# Patient Record
Sex: Female | Born: 1990 | Race: White | Hispanic: No | Marital: Single | Smoking: Never smoker
Health system: Southern US, Community
[De-identification: ages and names within clinical notes are randomized; demographics above are authoritative.]

## PROBLEM LIST (undated history)

## (undated) HISTORY — PX: BACK SURGERY: SHX140

---

## 2019-11-03 ENCOUNTER — Other Ambulatory Visit: Payer: Self-pay | Admitting: Orthopedic Surgery

## 2019-11-03 DIAGNOSIS — M4726 Other spondylosis with radiculopathy, lumbar region: Secondary | ICD-10-CM

## 2019-11-07 ENCOUNTER — Other Ambulatory Visit: Payer: Self-pay | Admitting: Cardiology

## 2019-11-14 ENCOUNTER — Ambulatory Visit
Admission: RE | Admit: 2019-11-14 | Discharge: 2019-11-14 | Disposition: A | Discharge status: Home / Self Care | Payer: 59 | Source: Ambulatory Visit | Attending: Orthopedic Surgery | Admitting: Orthopedic Surgery

## 2019-11-14 ENCOUNTER — Other Ambulatory Visit: Payer: Self-pay

## 2019-11-14 DIAGNOSIS — M4726 Other spondylosis with radiculopathy, lumbar region: Secondary | ICD-10-CM

## 2021-07-28 ENCOUNTER — Encounter (HOSPITAL_BASED_OUTPATIENT_CLINIC_OR_DEPARTMENT_OTHER): Payer: Self-pay | Admitting: Emergency Medicine

## 2021-07-28 ENCOUNTER — Other Ambulatory Visit: Payer: Self-pay

## 2021-07-28 ENCOUNTER — Emergency Department (HOSPITAL_BASED_OUTPATIENT_CLINIC_OR_DEPARTMENT_OTHER): Payer: Managed Care, Other (non HMO)

## 2021-07-28 ENCOUNTER — Emergency Department (HOSPITAL_BASED_OUTPATIENT_CLINIC_OR_DEPARTMENT_OTHER)
Admission: EM | Admit: 2021-07-28 | Discharge: 2021-07-28 | Disposition: A | Discharge status: Home / Self Care | Payer: Managed Care, Other (non HMO) | Attending: Emergency Medicine | Admitting: Emergency Medicine

## 2021-07-28 DIAGNOSIS — R102 Pelvic and perineal pain: Secondary | ICD-10-CM

## 2021-07-28 DIAGNOSIS — R1032 Left lower quadrant pain: Secondary | ICD-10-CM | POA: Diagnosis not present

## 2021-07-28 LAB — URINALYSIS, ROUTINE W REFLEX MICROSCOPIC
Bilirubin Urine: NEGATIVE
Glucose, UA: NEGATIVE mg/dL
Hgb urine dipstick: NEGATIVE
Ketones, ur: NEGATIVE mg/dL
Leukocytes,Ua: NEGATIVE
Nitrite: NEGATIVE
Protein, ur: NEGATIVE mg/dL
Specific Gravity, Urine: 1.015 (ref 1.005–1.030)
pH: 5.5 (ref 5.0–8.0)

## 2021-07-28 LAB — COMPREHENSIVE METABOLIC PANEL
ALT: 7 U/L (ref 0–44)
AST: 10 U/L — ABNORMAL LOW (ref 15–41)
Albumin: 4.6 g/dL (ref 3.5–5.0)
Alkaline Phosphatase: 35 U/L — ABNORMAL LOW (ref 38–126)
Anion gap: 10 (ref 5–15)
BUN: 7 mg/dL (ref 6–20)
CO2: 22 mmol/L (ref 22–32)
Calcium: 9.3 mg/dL (ref 8.9–10.3)
Chloride: 106 mmol/L (ref 98–111)
Creatinine, Ser: 0.58 mg/dL (ref 0.44–1.00)
GFR, Estimated: >60 mL/min (ref >60)
Glucose, Bld: 125 mg/dL — ABNORMAL HIGH (ref 70–99)
Potassium: 3.4 mmol/L — ABNORMAL LOW (ref 3.5–5.1)
Sodium: 138 mmol/L (ref 135–145)
Total Bilirubin: 0.4 mg/dL (ref 0.3–1.2)
Total Protein: 7.3 g/dL (ref 6.5–8.1)

## 2021-07-28 LAB — CBC
HCT: 40 % (ref 36.0–46.0)
Hemoglobin: 13.6 g/dL (ref 12.0–15.0)
MCH: 32.5 pg (ref 26.0–34.0)
MCHC: 34 g/dL (ref 30.0–36.0)
MCV: 95.5 fL (ref 80.0–100.0)
Platelets: 276 10*3/uL (ref 150–400)
RBC: 4.19 MIL/uL (ref 3.87–5.11)
RDW: 12.2 % (ref 11.5–15.5)
WBC: 10.4 10*3/uL (ref 4.0–10.5)
nRBC: 0 % (ref 0.0–0.2)

## 2021-07-28 LAB — LIPASE, BLOOD: Lipase: 37 U/L (ref 11–51)

## 2021-07-28 LAB — PREGNANCY, URINE: Preg Test, Ur: NEGATIVE

## 2021-07-28 MED ORDER — IOHEXOL 300 MG/ML  SOLN
80.0000 mL | Freq: Once | INTRAMUSCULAR | Status: AC | PRN
  Administered 2021-07-28: 80 mL via INTRAVENOUS

## 2021-07-28 NOTE — ED Provider Notes (Signed)
[facility] EMERGENCY DEPT Provider Note   CSN: [id number] Arrival date & time: 07/28/21  1557     History Chief Complaint  Patient presents with   Abdominal Pain    Morgan Townsend is a 30 y.o. female.   Abdominal Pain Pain location:  Suprapubic Pain quality: cramping   Pain radiates to:  LLQ Pain severity:  Severe Onset quality:  Sudden Duration:  3 hours Timing:  Intermittent Progression:  Improving Chronicity:  New Relieved by:  Acetaminophen Worsened by:  Position changes Ineffective treatments:  Lying down Associated symptoms: dysuria and nausea   Associated symptoms: no chest pain, no chills, no constipation, no cough, no diarrhea, no fatigue, no fever, no hematuria, no shortness of breath, no sore throat, no vaginal bleeding, no vaginal discharge and no vomiting   Patient presents for left lower quadrant abdominal pain.  Onset was today, shortly prior to arrival.  Pain has been severe.  Last onset.  Was 4 weeks ago.  She has not had any recent vaginal bleeding, discharge, or hematuria.    History reviewed. No pertinent past medical history.  There are no problems to display for this patient.   History reviewed. No pertinent surgical history.   OB History   No obstetric history on file.     History reviewed. No pertinent family history.     Home Medications Prior to Admission medications   Not on File    Allergies    Patient has no known allergies.  Review of Systems   Review of Systems  Constitutional:  Negative for activity change, appetite change, chills, diaphoresis, fatigue and fever.  HENT:  Negative for congestion, ear pain and sore throat.   Eyes:  Negative for pain and visual disturbance.  Respiratory:  Negative for cough, chest tightness, shortness of breath and wheezing.   Cardiovascular:  Negative for chest pain and palpitations.  Gastrointestinal:  Positive for abdominal pain and nausea. Negative for blood in stool,  constipation, diarrhea and vomiting.  Genitourinary:  Positive for dysuria and pelvic pain. Negative for flank pain, frequency, hematuria, vaginal bleeding and vaginal discharge.  Musculoskeletal:  Negative for arthralgias, back pain, gait problem, joint swelling, myalgias and neck pain.  Skin:  Negative for color change and rash.  Neurological:  Positive for light-headedness. Negative for dizziness, tremors, seizures, syncope, weakness, numbness and headaches.  Hematological:  Does not bruise/bleed easily.  Psychiatric/Behavioral:  Negative for confusion and decreased concentration.   All other systems reviewed and are negative.  Physical Exam Updated Vital Signs BP 110/71   Pulse 75   Temp 98.5 F (36.9 C) (Oral)   Resp 18   Ht 5\' 6"  (1.676 m)   Wt 61.2 kg   SpO2 99%   BMI 21.79 kg/m   Physical Exam Vitals and nursing note reviewed.  Constitutional:      General: She is not in acute distress.    Appearance: She is well-developed and normal weight. She is not ill-appearing, toxic-appearing or diaphoretic.  HENT:     Head: Normocephalic and atraumatic.     Mouth/Throat:     Mouth: Mucous membranes are moist.     Pharynx: Oropharynx is clear.  Eyes:     General: No scleral icterus.    Conjunctiva/sclera: Conjunctivae normal.     Pupils: Pupils are equal, round, and reactive to light.  Cardiovascular:     Rate and Rhythm: Normal rate and regular rhythm.     Heart sounds: No murmur heard. Pulmonary:  Effort: Pulmonary effort is normal. No respiratory distress.     Breath sounds: Normal breath sounds. No wheezing or rales.  Abdominal:     Palpations: Abdomen is soft.     Tenderness: There is abdominal tenderness in the right lower quadrant, periumbilical area, suprapubic area and left lower quadrant. There is no right CVA tenderness, left CVA tenderness, guarding or rebound.  Musculoskeletal:        General: No swelling.     Cervical back: Neck supple.  Skin:     General: Skin is warm and dry.     Capillary Refill: Capillary refill takes less than 2 seconds.     Coloration: Skin is not jaundiced or pale.  Neurological:     General: No focal deficit present.     Mental Status: She is alert and oriented to person, place, and time.  Psychiatric:        Mood and Affect: Mood normal.        Behavior: Behavior normal.    ED Results / Procedures / Treatments   Labs (all labs ordered are listed, but only abnormal results are displayed) Labs Reviewed  COMPREHENSIVE METABOLIC PANEL - Abnormal; Notable for the following components:      Result Value   Potassium 3.4 (*)    Glucose, Bld 125 (*)    AST 10 (*)    Alkaline Phosphatase 35 (*)    All other components within normal limits  URINALYSIS, ROUTINE W REFLEX MICROSCOPIC - Abnormal; Notable for the following components:   APPearance HAZY (*)    All other components within normal limits  LIPASE, BLOOD  CBC  PREGNANCY, URINE    EKG None  Radiology CT ABDOMEN PELVIS W CONTRAST  Result Date: 07/28/2021 CLINICAL DATA:  Left lower quadrant abdominal pain, nausea, vomiting EXAM: CT ABDOMEN AND PELVIS WITH CONTRAST TECHNIQUE: Multidetector CT imaging of the abdomen and pelvis was performed using the standard protocol following bolus administration of intravenous contrast. CONTRAST:  70mL OMNIPAQUE IOHEXOL 300 MG/ML  SOLN COMPARISON:  None. FINDINGS: Lower chest: 3 mm nodule in the left lower lobe peripherally. No effusions. Hepatobiliary: No focal hepatic abnormality. Gallbladder unremarkable. Pancreas: No focal abnormality or ductal dilatation. Spleen: No focal abnormality.  Normal size. Adrenals/Urinary Tract: No adrenal abnormality. No focal renal abnormality. No stones or hydronephrosis. Urinary bladder is unremarkable. Stomach/Bowel: Appendix not visualized. Bowel is decompressed. No bowel obstruction. Grossly unremarkable. Vascular/Lymphatic: No evidence of aneurysm or adenopathy. Reproductive:  Uterus and left ovary unremarkable. Cystic areas within the right ovary measuring up to 2.3 cm, likely small functional cysts or follicles. Other: Moderate free fluid in the pelvis and in the right paracolic gutter. Musculoskeletal: No acute bony abnormality. IMPRESSION: Moderate free fluid in the pelvis. Small cysts or follicles in the right ovary. Appendix not visualized. No inflammatory process in the right lower quadrant. Electronically Signed   By: Rolm Baptise M.D.   On: 07/28/2021 20:01   US PELVIC COMPLETE W TRANSVAGINAL AND TORSION R/O  Result Date: 07/28/2021 CLINICAL DATA:  Left lower quadrant pain EXAM: TRANSABDOMINAL AND TRANSVAGINAL ULTRASOUND OF PELVIS DOPPLER ULTRASOUND OF OVARIES TECHNIQUE: Both transabdominal and transvaginal ultrasound examinations of the pelvis were performed. Transabdominal technique was performed for global imaging of the pelvis including uterus, ovaries, adnexal regions, and pelvic cul-de-sac. It was necessary to proceed with endovaginal exam following the transabdominal exam to visualize the uterus, endometrium, ovaries and adnexa. Color and duplex Doppler ultrasound was utilized to evaluate blood flow to the ovaries. COMPARISON:  None. FINDINGS: Uterus Measurements: 8.6 x 4.3 x 5.7 cm = volume: 111 mL. 1.8 cm posterior subserosal fundal fibroid. Endometrium Thickness: 7 mm in thickness.  No focal abnormality visualized. Right ovary Measurements: 4.6 x 4.1 x 4.1 cm = volume: 40 mL. 2.2 cm hyperechoic lesion, possibly small dermoid. Left ovary Measurements: 2.3 x 2.4 x 2.5 cm = volume: 7.2 mL. Normal appearance/no adnexal mass. Pulsed Doppler evaluation of both ovaries demonstrates normal low-resistance arterial and venous waveforms. Other findings Large amount of free fluid in the pelvis. IMPRESSION: 2.2 cm hyperechoic area within the right ovary could reflect small dermoid cyst. No evidence of ovarian torsion. Large free fluid in the pelvis. Electronically Signed   By:  Charlett Nose M.D.   On: 07/28/2021 18:45    Procedures Procedures   Medications Ordered in ED Medications  iohexol (OMNIPAQUE) 300 MG/ML solution 80 mL (80 mLs Intravenous Contrast Given 07/28/21 1937)    ED Course  I have reviewed the triage vital signs and the nursing notes.  Pertinent labs & imaging results that were available during my care of the patient were reviewed by me and considered in my medical decision making (see chart for details).    MDM Rules/Calculators/A&P                          Patient presents for acute onset of lower abdominal/pelvic pain.  Pain is midline but does radiate more to the left side.  Vital signs upon arrival are normal.  Patient is well-appearing.  She does have some tenderness to the lower abdomen on exam.  Exam is otherwise unremarkable.  Patient declines any analgesia or nausea medicine at this time.  Laboratory work-up was initiated prior to being bedded in the ED.  Pregnancy test was negative.  Pelvic ultrasound was ordered to assess for adnexal etiology.  Ultrasound showed no evidence of torsion.  There was findings consistent with a possible dermoid cyst on the right ovary.  Left ovary was normal in appearance.  There was a large amount of free fluid present in pelvis.  This is likely from a ruptured cyst.  Free fluid does explain her symptoms.  Given the large amount of fluid, CT scan of abdomen and pelvis was ordered to rule out other sources of pelvic free fluid.  CT scan showed redemonstration of right ovarian cysts, and pelvic free fluid.  There were no other acute findings.  Patient had no worsening of her symptoms while in the ED.  She was advised to continue analgesia at home with ibuprofen and to return to the ED if symptoms worsen.  She was discharged in good condition.  Final Clinical Impression(s) / ED Diagnoses Final diagnoses:  Pelvic pain    Rx / DC Orders ED Discharge Orders     None        Gloris Manchester, MD 07/30/21  [phone]

## 2021-07-28 NOTE — ED Notes (Signed)
Patient transported to Ultrasound 

## 2021-07-28 NOTE — ED Triage Notes (Signed)
Pt arrives to ED with c/o LLQ abdominal pain. This started this afternoon. The pain is severe, intermittent, and radiates up her abdomen. Pt denies vaginal bleeding, dysuria, hematuria. Pt reports lower back surgery x3 weeks ago. LMP 11/17.

## 2021-12-29 ENCOUNTER — Other Ambulatory Visit: Payer: Self-pay | Admitting: Orthopaedic Surgery

## 2021-12-29 DIAGNOSIS — M5116 Intervertebral disc disorders with radiculopathy, lumbar region: Secondary | ICD-10-CM

## 2022-01-10 ENCOUNTER — Ambulatory Visit
Admission: RE | Admit: 2022-01-10 | Discharge: 2022-01-10 | Disposition: A | Discharge status: Home / Self Care | Payer: Managed Care, Other (non HMO) | Source: Ambulatory Visit | Attending: Orthopaedic Surgery | Admitting: Orthopaedic Surgery

## 2022-01-10 DIAGNOSIS — M5116 Intervertebral disc disorders with radiculopathy, lumbar region: Secondary | ICD-10-CM

## 2022-08-26 LAB — OB RESULTS CONSOLE GC/CHLAMYDIA
Chlamydia: NEGATIVE
Neisseria Gonorrhea: NEGATIVE

## 2022-09-18 LAB — OB RESULTS CONSOLE HIV ANTIBODY (ROUTINE TESTING): HIV: NONREACTIVE

## 2022-09-18 LAB — HEPATITIS C ANTIBODY: HCV Ab: NEGATIVE

## 2022-09-18 LAB — OB RESULTS CONSOLE HEPATITIS B SURFACE ANTIGEN: Hepatitis B Surface Ag: NEGATIVE

## 2022-09-18 LAB — OB RESULTS CONSOLE ABO/RH: RH Type: POSITIVE

## 2022-09-18 LAB — OB RESULTS CONSOLE RPR: RPR: NONREACTIVE

## 2022-09-18 LAB — OB RESULTS CONSOLE ANTIBODY SCREEN: Antibody Screen: NEGATIVE

## 2022-09-18 LAB — OB RESULTS CONSOLE RUBELLA ANTIBODY, IGM: Rubella: IMMUNE

## 2022-10-15 ENCOUNTER — Other Ambulatory Visit: Payer: Self-pay | Admitting: Obstetrics and Gynecology

## 2022-10-15 DIAGNOSIS — Z363 Encounter for antenatal screening for malformations: Secondary | ICD-10-CM

## 2022-11-11 ENCOUNTER — Encounter: Payer: Self-pay | Admitting: *Deleted

## 2022-11-19 ENCOUNTER — Ambulatory Visit: Payer: BC Managed Care – PPO | Attending: Obstetrics and Gynecology

## 2022-11-19 DIAGNOSIS — Z363 Encounter for antenatal screening for malformations: Secondary | ICD-10-CM

## 2022-11-20 ENCOUNTER — Other Ambulatory Visit: Payer: Self-pay | Admitting: *Deleted

## 2022-11-20 DIAGNOSIS — Z362 Encounter for other antenatal screening follow-up: Secondary | ICD-10-CM

## 2022-12-13 IMAGING — US US PELVIS COMPLETE TRANSABD/TRANSVAG W DUPLEX
1 series · 13 of 25 positions shown · non-contrast
Comparison: None.

CLINICAL DATA: Left lower quadrant pain

EXAM:
TRANSABDOMINAL AND TRANSVAGINAL ULTRASOUND OF PELVIS
DOPPLER ULTRASOUND OF OVARIES
TECHNIQUE: Both transabdominal and transvaginal ultrasound examinations of the
pelvis were performed. Transabdominal technique was performed for
global imaging of the pelvis including uterus, ovaries, adnexal
regions, and pelvic cul-de-sac.
It was necessary to proceed with endovaginal exam following the
transabdominal exam to visualize the uterus, endometrium, ovaries
and adnexa. Color and duplex Doppler ultrasound was utilized to
evaluate blood flow to the ovaries.

[Series 1: us pelvic complete w transvaginal and torsion righ · 72 acquisitions, 13 frames shown]
[im 1/72]
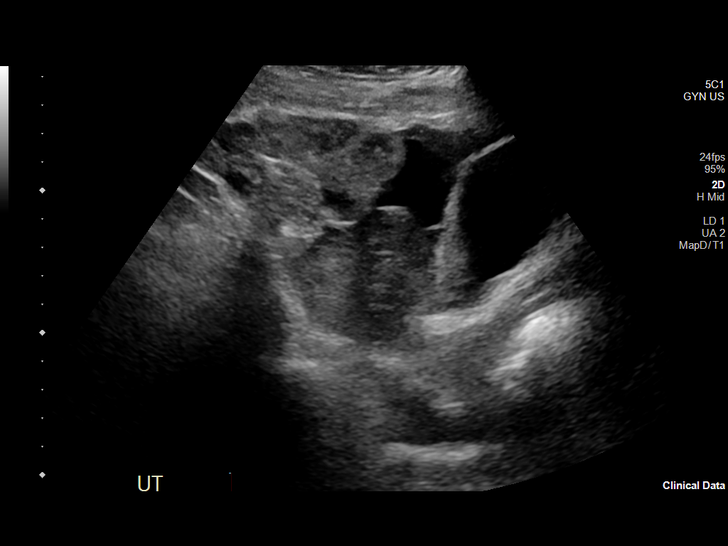
[im 6/72]
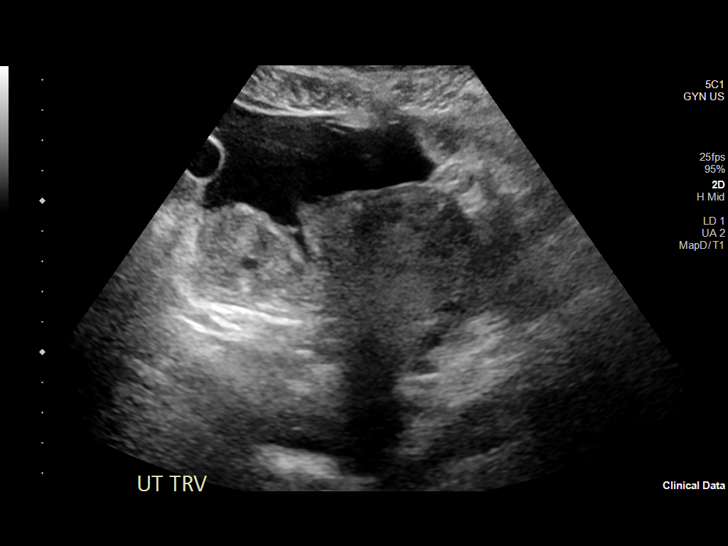
[im 12/72]
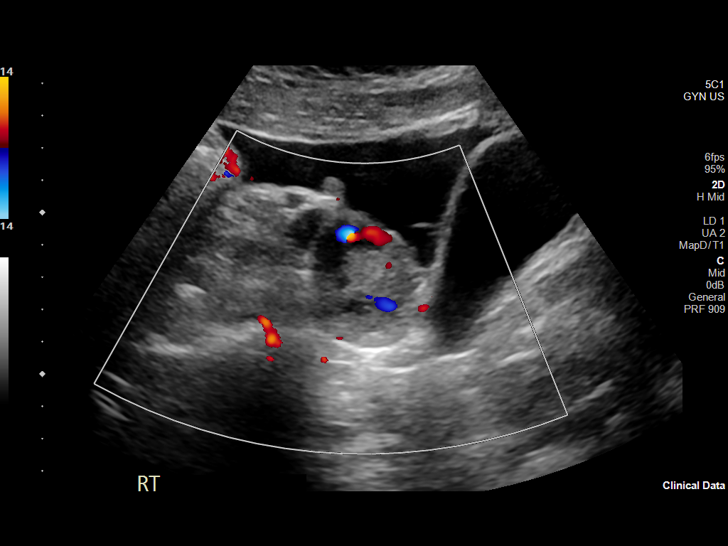
[im 18/72]
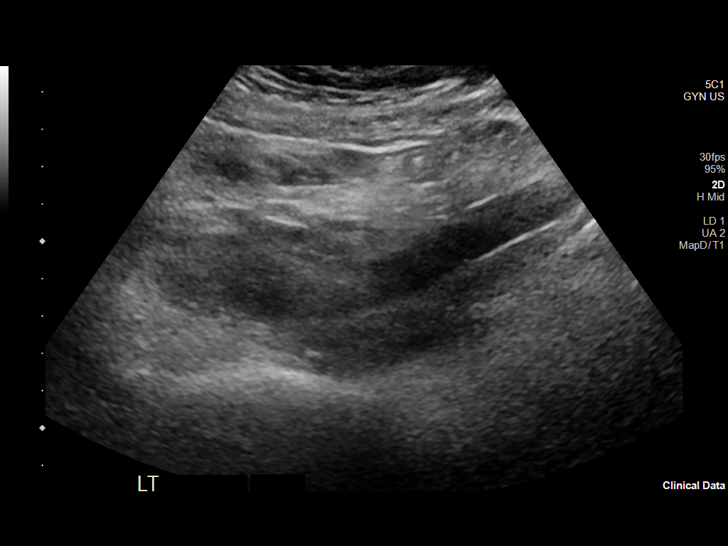
[im 24/72]
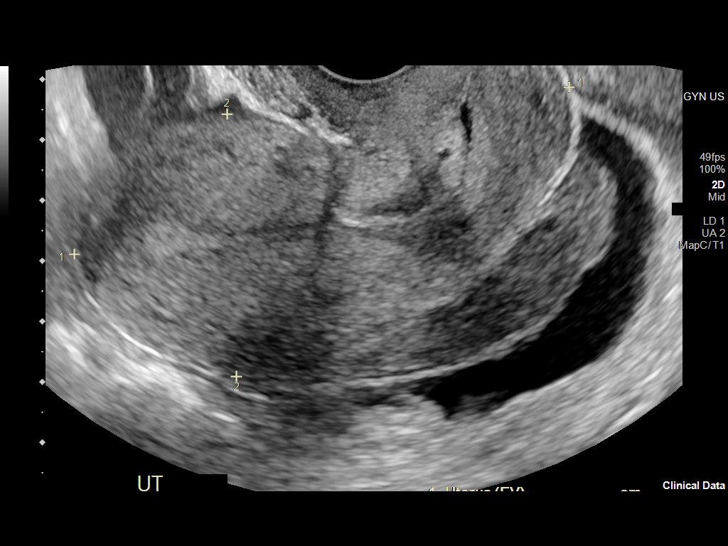
[im 30/72]
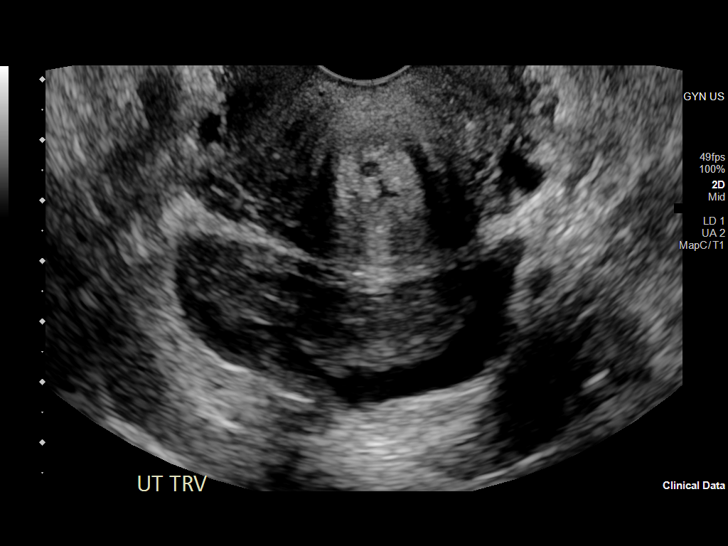
[im 36/72]
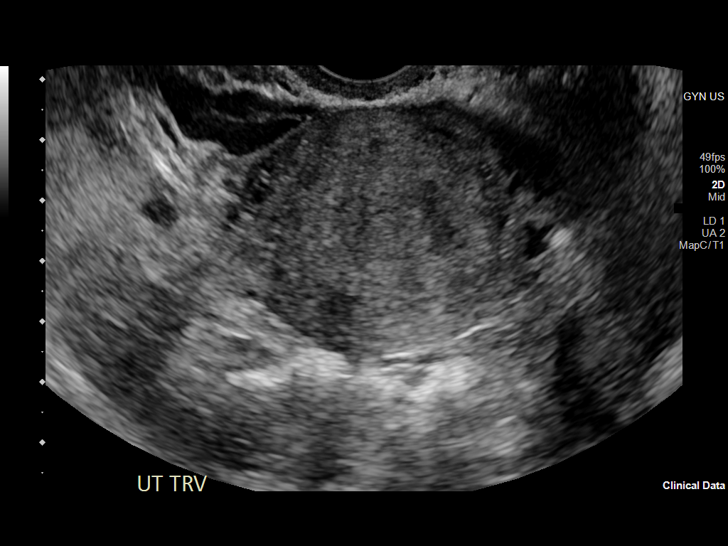
[im 42/72]
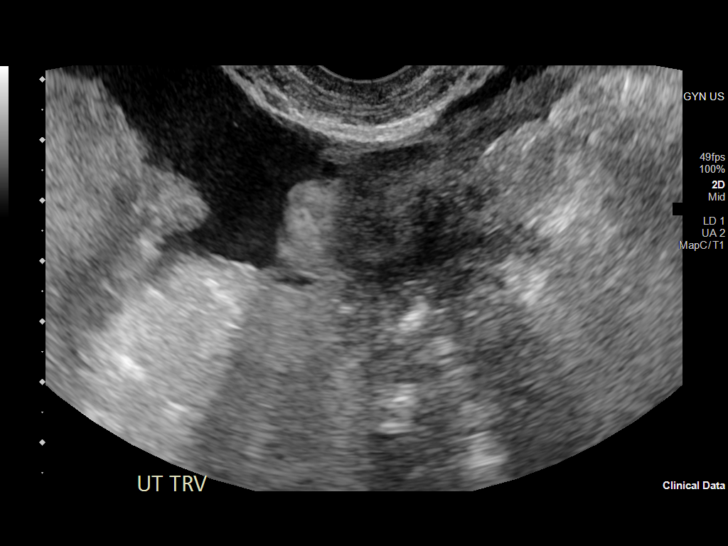
[im 48/72]
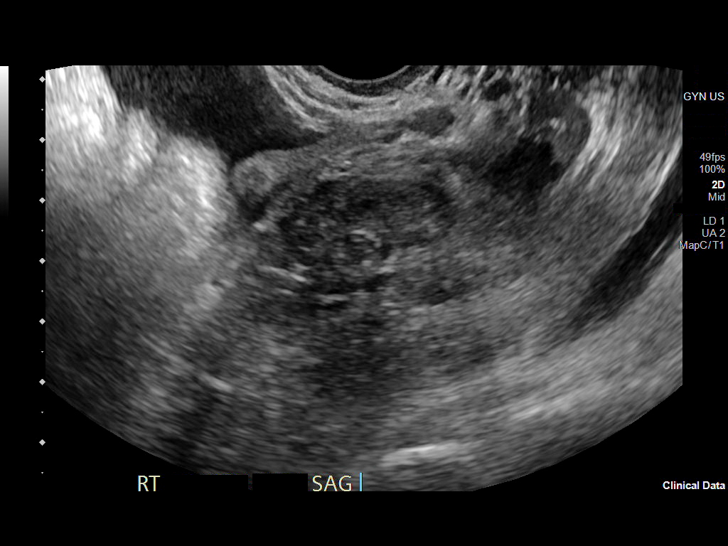
[im 54/72]
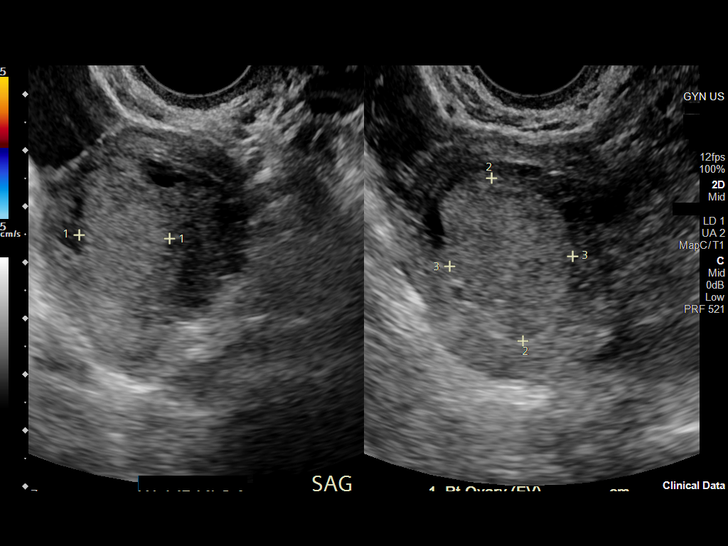
[im 60/72]
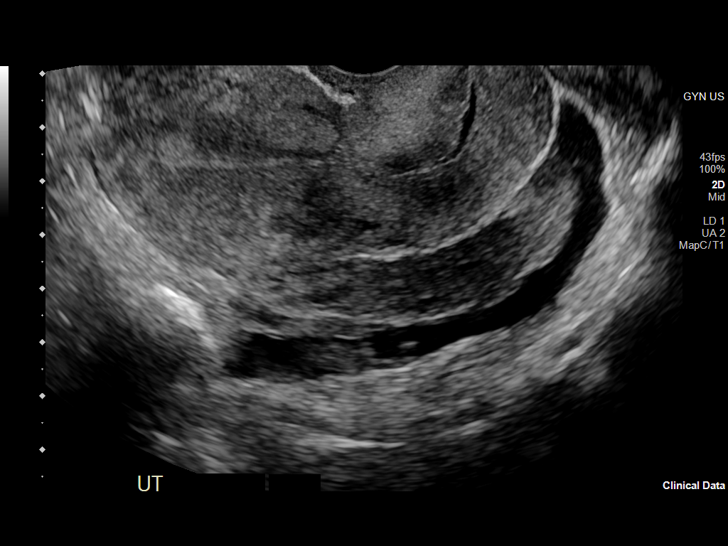
[im 66/72]
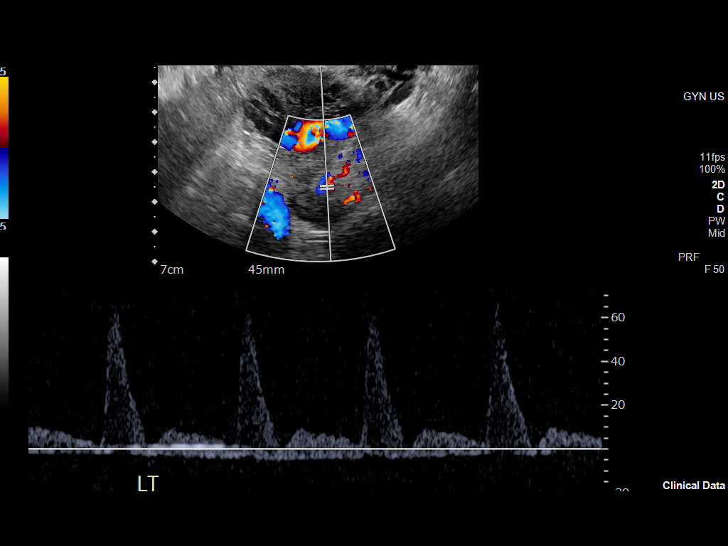
[im 72/72]
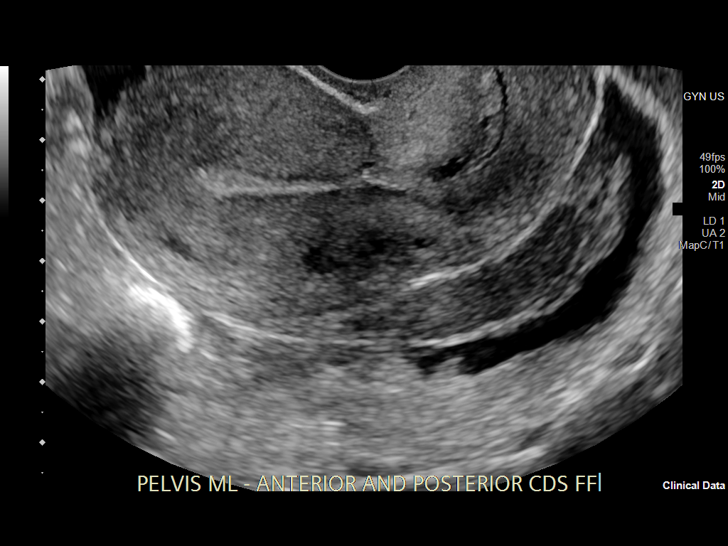

[13 of 25 positions shown; findings below may reference images not displayed]

FINDINGS: Uterus

Measurements: 8.6 x 4.3 x 5.7 cm = volume: 111 mL. 1.8 cm posterior
subserosal fundal fibroid.

Endometrium

Thickness: 7 mm in thickness.  No focal abnormality visualized.

Right ovary

Measurements: 4.6 x 4.1 x 4.1 cm = volume: 40 mL. 2.2 cm hyperechoic
lesion, possibly small dermoid.

Left ovary

Measurements: 2.3 x 2.4 x 2.5 cm = volume: 7.2 mL. Normal
appearance/no adnexal mass.

Pulsed Doppler evaluation of both ovaries demonstrates normal
low-resistance arterial and venous waveforms.

Other findings

Large amount of free fluid in the pelvis.
IMPRESSION: 2.2 cm hyperechoic area within the right ovary could reflect small
dermoid cyst.

No evidence of ovarian torsion.

Large free fluid in the pelvis.

## 2022-12-13 IMAGING — CT CT ABD-PELV W/ CM
2 of 4 series · 16 of 46 positions shown, 18 images · IV contrast (APPLIED)
Comparison: None.

CLINICAL DATA: Left lower quadrant abdominal pain, nausea, vomiting

EXAM:
CT ABDOMEN AND PELVIS WITH CONTRAST
TECHNIQUE: Multidetector CT imaging of the abdomen and pelvis was performed
using the standard protocol following bolus administration of
intravenous contrast.
CONTRAST:  80mL OMNIPAQUE IOHEXOL 300 MG/ML  SOLN

[Series 2: abd pel w · axial · 0.66mm/px · z∈[+747,+1122]mm · 13 of 83 slices shown, 15 images]
[im 4/83  soft-tissue]
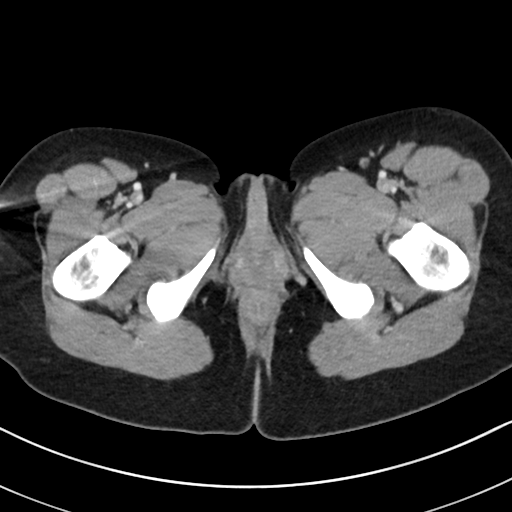
[im 4/83  bone]
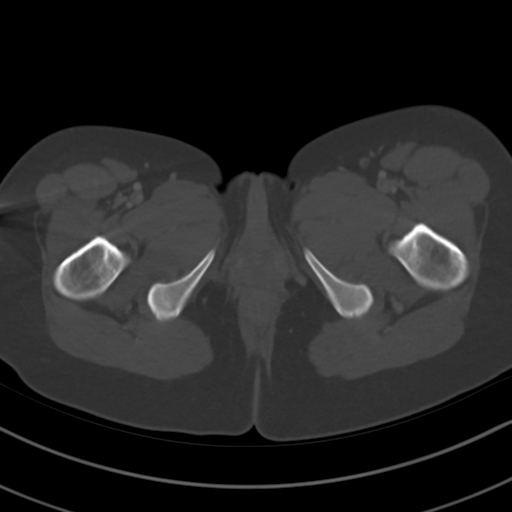
[im 11/83  soft-tissue]
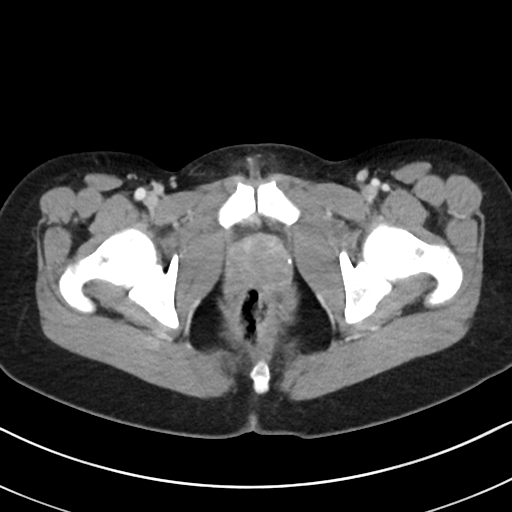
[im 18/83  soft-tissue]
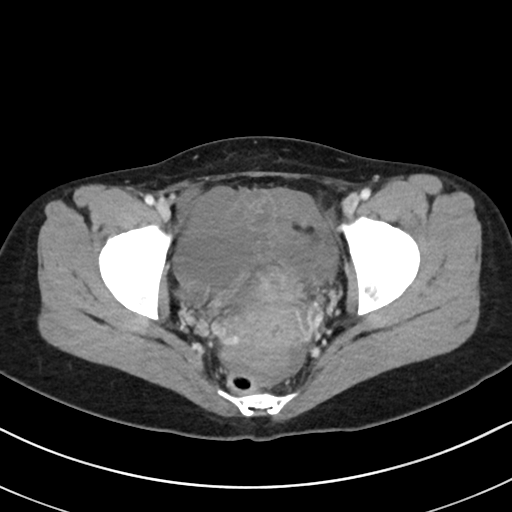
[im 24/83  soft-tissue]
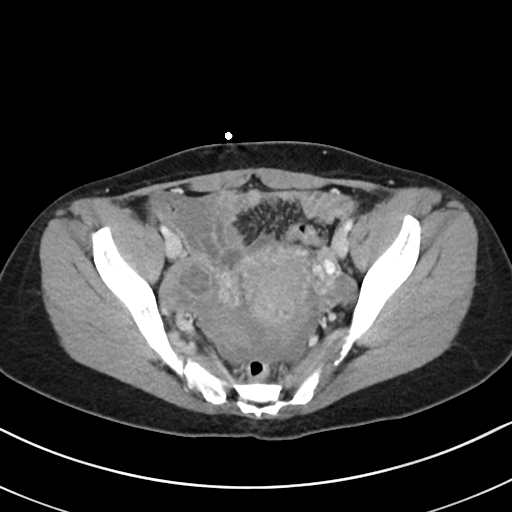
[im 28/83  soft-tissue]
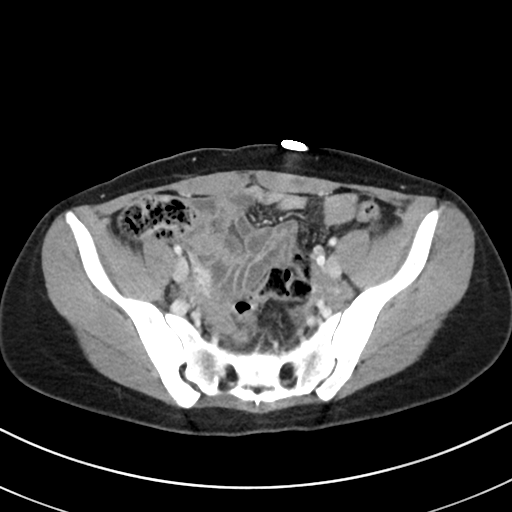
[im 35/83  soft-tissue]
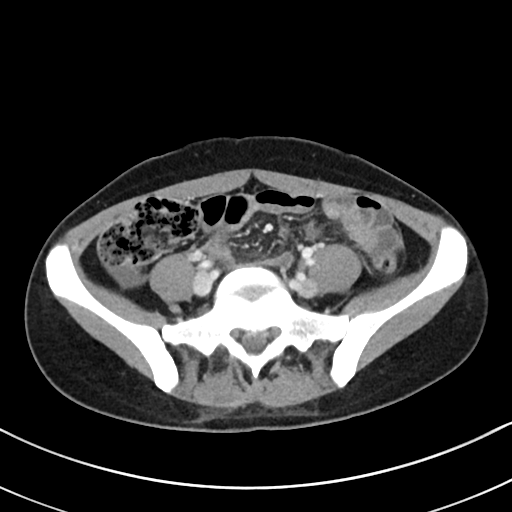
[im 42/83  soft-tissue]
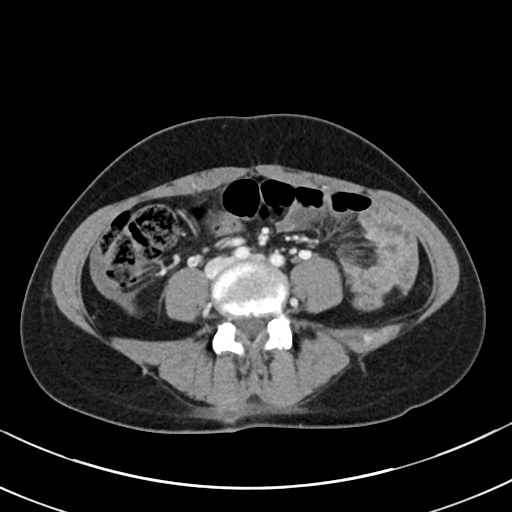
[im 48/83  soft-tissue]
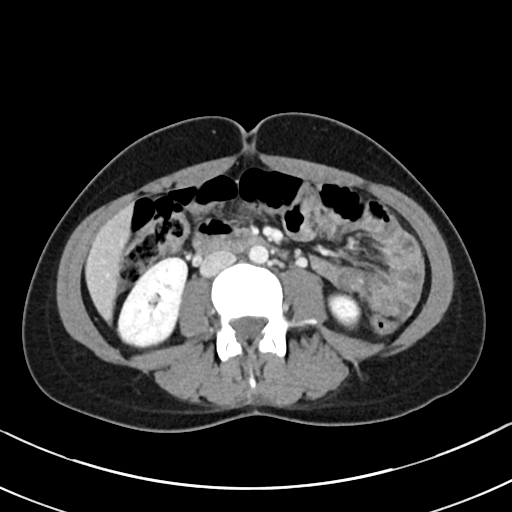
[im 55/83  soft-tissue]
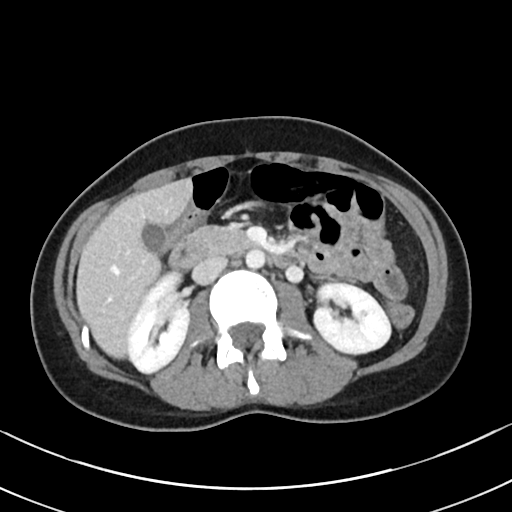
[im 55/83  bone]
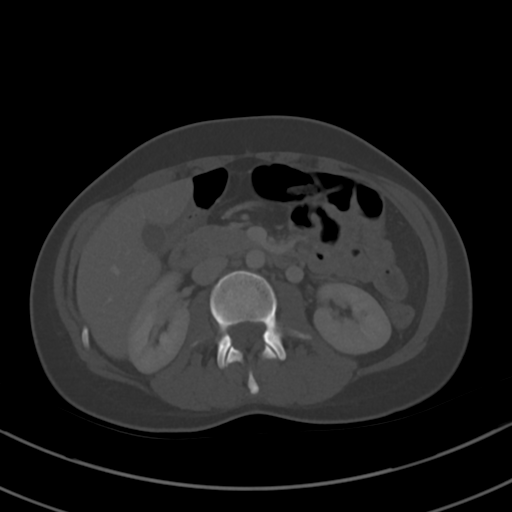
[im 59/83  soft-tissue]
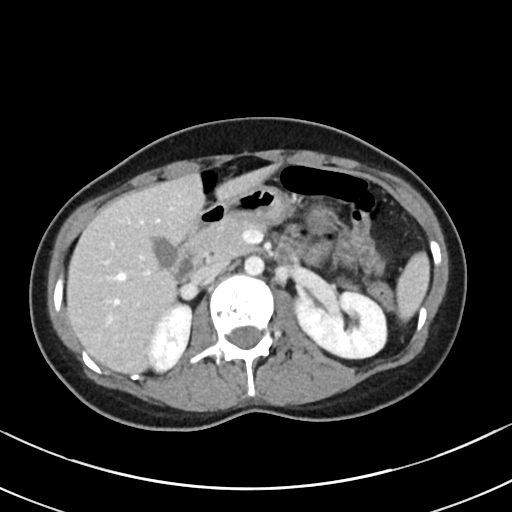
[im 65/83  soft-tissue]
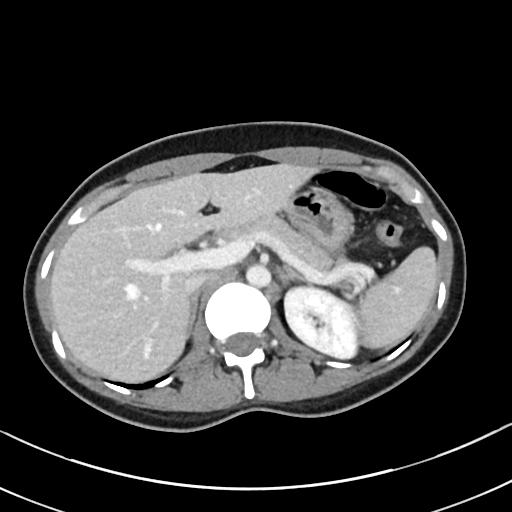
[im 72/83  soft-tissue]
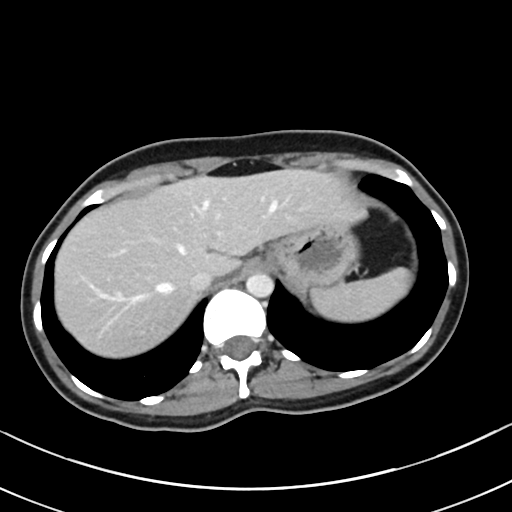
[im 79/83  soft-tissue]
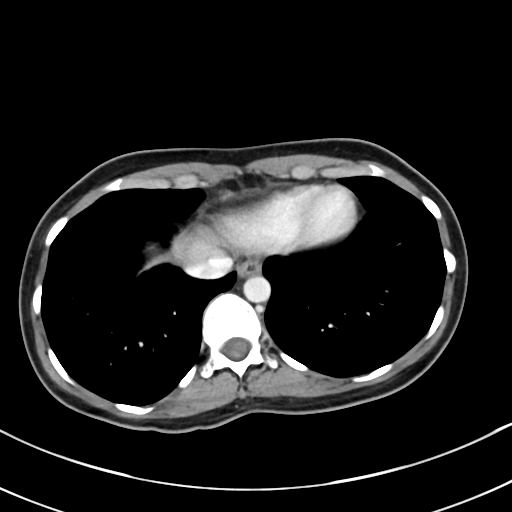

[Series 5: coronal · coronal · 0.64mm/px · 3 of 72 slices shown]
[im 24/72  soft-tissue]
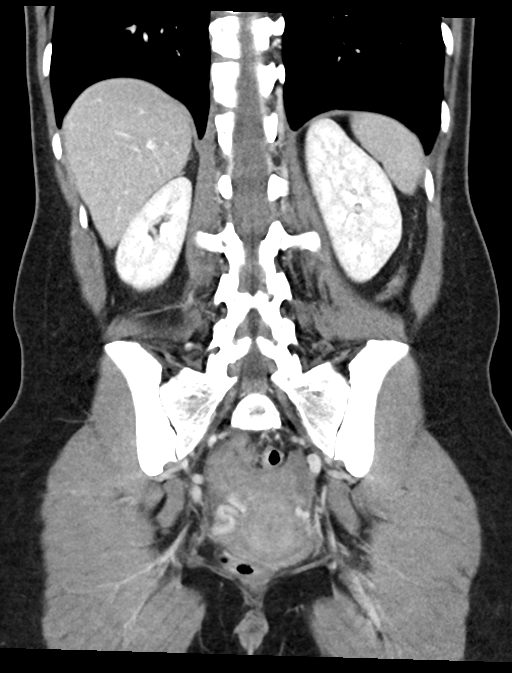
[im 32/72  soft-tissue]
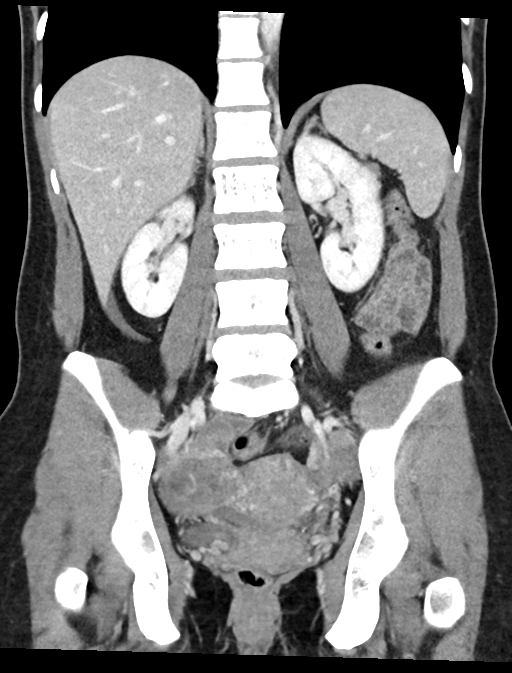
[im 40/72  soft-tissue]
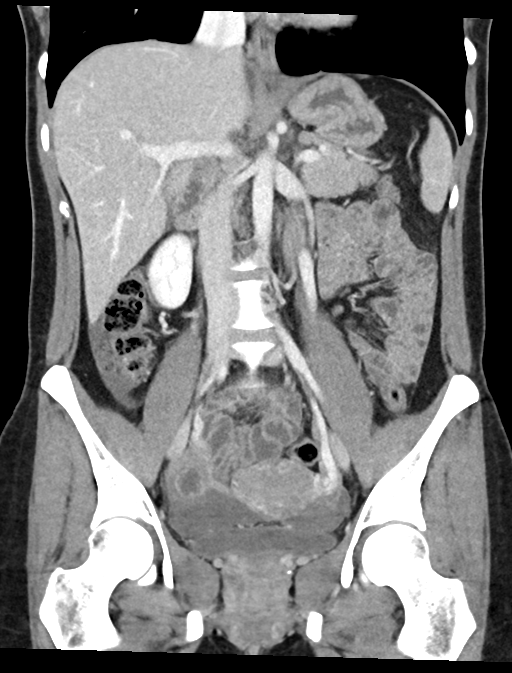

[16 of 46 positions shown; findings below may reference images not displayed]

FINDINGS: Lower chest: 3 mm nodule in the left lower lobe peripherally. No
effusions.

Hepatobiliary: No focal hepatic abnormality. Gallbladder
unremarkable.

Pancreas: No focal abnormality or ductal dilatation.

Spleen: No focal abnormality.  Normal size.

Adrenals/Urinary Tract: No adrenal abnormality. No focal renal
abnormality. No stones or hydronephrosis. Urinary bladder is
unremarkable.

Stomach/Bowel: Appendix not visualized. Bowel is decompressed. No
bowel obstruction. Grossly unremarkable.

Vascular/Lymphatic: No evidence of aneurysm or adenopathy.

Reproductive: Uterus and left ovary unremarkable. Cystic areas
within the right ovary measuring up to 2.3 cm, likely small
functional cysts or follicles.

Other: Moderate free fluid in the pelvis and in the right paracolic
gutter.

Musculoskeletal: No acute bony abnormality.
IMPRESSION: Moderate free fluid in the pelvis.

Small cysts or follicles in the right ovary.

Appendix not visualized. No inflammatory process in the right lower
quadrant.

## 2022-12-17 ENCOUNTER — Ambulatory Visit: Payer: BC Managed Care – PPO

## 2023-03-09 LAB — OB RESULTS CONSOLE GBS: GBS: NEGATIVE

## 2023-04-08 ENCOUNTER — Telehealth (HOSPITAL_COMMUNITY): Payer: Self-pay | Admitting: *Deleted

## 2023-04-08 NOTE — Telephone Encounter (Signed)
Preadmission screen  

## 2023-04-09 ENCOUNTER — Encounter (HOSPITAL_COMMUNITY): Payer: Self-pay | Admitting: *Deleted

## 2023-04-11 ENCOUNTER — Other Ambulatory Visit: Payer: Self-pay

## 2023-04-11 ENCOUNTER — Inpatient Hospital Stay (HOSPITAL_COMMUNITY)
Admission: AD | Admit: 2023-04-11 | Discharge: 2023-04-13 | Disposition: A | Discharge status: Home / Self Care | Payer: BC Managed Care – PPO | Attending: Obstetrics | Admitting: Obstetrics

## 2023-04-11 ENCOUNTER — Encounter (HOSPITAL_COMMUNITY): Payer: Self-pay | Admitting: Obstetrics and Gynecology

## 2023-04-11 DIAGNOSIS — O34211 Maternal care for low transverse scar from previous cesarean delivery: Secondary | ICD-10-CM | POA: Diagnosis not present

## 2023-04-11 DIAGNOSIS — O480 Post-term pregnancy: Principal | ICD-10-CM | POA: Diagnosis present

## 2023-04-11 DIAGNOSIS — Z3A40 40 weeks gestation of pregnancy: Secondary | ICD-10-CM | POA: Diagnosis not present

## 2023-04-11 LAB — CBC
HCT: 37.4 % (ref 36.0–46.0)
Hemoglobin: 12.9 g/dL (ref 12.0–15.0)
MCH: 32.7 pg (ref 26.0–34.0)
MCHC: 34.5 g/dL (ref 30.0–36.0)
MCV: 94.7 fL (ref 80.0–100.0)
Platelets: 198 10*3/uL (ref 150–400)
RBC: 3.95 MIL/uL (ref 3.87–5.11)
RDW: 12.9 % (ref 11.5–15.5)
WBC: 10.7 10*3/uL — ABNORMAL HIGH (ref 4.0–10.5)
nRBC: 0 % (ref 0.0–0.2)

## 2023-04-11 LAB — TYPE AND SCREEN
ABO/RH(D): A POS
Antibody Screen: NEGATIVE

## 2023-04-11 MED ORDER — SOD CITRATE-CITRIC ACID 500-334 MG/5ML PO SOLN
30.0000 mL | ORAL | Status: DC | PRN
  Administered 2023-04-12: 30 mL via ORAL
  Filled 2023-04-11: qty 30

## 2023-04-11 MED ORDER — LACTATED RINGERS IV SOLN
500.0000 mL | INTRAVENOUS | Status: DC | PRN
  Administered 2023-04-12: 500 mL via INTRAVENOUS

## 2023-04-11 MED ORDER — OXYTOCIN-SODIUM CHLORIDE 30-0.9 UT/500ML-% IV SOLN
1.0000 m[IU]/min | INTRAVENOUS | Status: DC
  Administered 2023-04-11: 1 m[IU]/min via INTRAVENOUS
  Filled 2023-04-11: qty 500

## 2023-04-11 MED ORDER — OXYCODONE-ACETAMINOPHEN 5-325 MG PO TABS: 2.0000 | ORAL_TABLET | ORAL | Status: DC | PRN

## 2023-04-11 MED ORDER — OXYTOCIN BOLUS FROM INFUSION: 333.0000 mL | Freq: Once | INTRAVENOUS | Status: DC

## 2023-04-11 MED ORDER — LACTATED RINGERS IV SOLN: INTRAVENOUS | Status: DC

## 2023-04-11 MED ORDER — FENTANYL CITRATE (PF) 100 MCG/2ML IJ SOLN
50.0000 ug | INTRAMUSCULAR | Status: DC | PRN
  Administered 2023-04-11 – 2023-04-12 (×2): 100 ug via INTRAVENOUS
  Filled 2023-04-11 (×3): qty 2

## 2023-04-11 MED ORDER — OXYCODONE-ACETAMINOPHEN 5-325 MG PO TABS: 1.0000 | ORAL_TABLET | ORAL | Status: DC | PRN

## 2023-04-11 MED ORDER — TERBUTALINE SULFATE 1 MG/ML IJ SOLN: 0.2500 mg | Freq: Once | INTRAMUSCULAR | Status: DC | PRN

## 2023-04-11 MED ORDER — OXYTOCIN-SODIUM CHLORIDE 30-0.9 UT/500ML-% IV SOLN: 2.5000 [IU]/h | INTRAVENOUS | Status: DC

## 2023-04-11 MED ORDER — ONDANSETRON HCL 4 MG/2ML IJ SOLN
4.0000 mg | Freq: Four times a day (QID) | INTRAMUSCULAR | Status: DC | PRN
  Administered 2023-04-12: 4 mg via INTRAVENOUS
  Filled 2023-04-11: qty 2

## 2023-04-11 MED ORDER — ACETAMINOPHEN 325 MG PO TABS: 650.0000 mg | ORAL_TABLET | ORAL | Status: DC | PRN

## 2023-04-11 MED ORDER — LIDOCAINE HCL (PF) 1 % IJ SOLN: 30.0000 mL | INTRAMUSCULAR | Status: DC | PRN

## 2023-04-11 NOTE — H&P (Signed)
32 y.o. G2P1001 @ [redacted]w[redacted]d presents with post term induction of labor.  Otherwise has good fetal movement and no bleeding.  Pregnancy complicated by: History of cesarean section.  41 wk IOL with G1.  Reports NRFS at 4 cm leading to primary cesarean section.  Strongly desires TOLAC.  History reviewed. No pertinent past medical history.  Past Surgical History:  Procedure Laterality Date   BACK SURGERY     CESAREAN SECTION      OB History  Gravida Para Term Preterm AB Living  2 1 1     1   SAB IAB Ectopic Multiple Live Births          1    # Outcome Date GA Lbr Len/2nd Weight Sex Type Anes PTL Lv  2 Current           1 Term      CS-LTranv   LIV    Social History   Socioeconomic History   Marital status: Single    Spouse name: Not on file   Number of children: Not on file   Years of education: Not on file   Highest education level: Not on file  Occupational History   Not on file  Tobacco Use   Smoking status: Never    Passive exposure: Never   Smokeless tobacco: Never  Vaping Use   Vaping status: Never Used  Substance and Sexual Activity   Alcohol use: Never   Drug use: Never   Sexual activity: Yes  Other Topics Concern   Not on file  Social History Narrative   Not on file   Social Determinants of Health   Financial Resource Strain: Not on file  Food Insecurity: No Food Insecurity (04/11/2023)   Hunger Vital Sign    Worried About Running Out of Food in the Last Year: Never true    Ran Out of Food in the Last Year: Never true  Transportation Needs: No Transportation Needs (04/11/2023)   PRAPARE - Administrator, Civil Service (Medical): No    Lack of Transportation (Non-Medical): No  Physical Activity: Not on file  Stress: Not on file  Social Connections: Unknown (12/16/2021)   Received from [facility], [facility]   Social Network    Social Network: Not on file  Intimate Partner Violence: Not At Risk (04/11/2023)   Humiliation, Afraid, Rape, and Kick  questionnaire    Fear of Current or Ex-Partner: No    Emotionally Abused: No    Physically Abused: No    Sexually Abused: No   Patient has no known allergies.    Prenatal Transfer Tool  Maternal Diabetes: No Genetic Screening: Normal Maternal Ultrasounds/Referrals: Normal Fetal Ultrasounds or other Referrals:  None Maternal Substance Abuse:  No Significant Maternal Medications:  None Significant Maternal Lab Results: Group B Strep negative  ABO, Rh: --/--/A POS (08/25 1824) Antibody: NEG (08/25 1824) Rubella: Immune (02/02 0000) RPR: Nonreactive (02/02 0000)  HBsAg: Negative (02/02 0000)  HIV: Non-reactive (02/02 0000)  GBS: Negative/-- (07/23 0000)     Vitals:   04/11/23 1810 04/11/23 2000  BP: 128/74 122/72  Pulse: 99 83  Resp:  16  Temp: 98.3 F (36.8 C) 98.1 F (36.7 C)     General:  NAD Abdomen:  soft, gravid, EFW 9# Ex:  no edema SVE:  closed/50/ballotable.  Foley bulb placed through closed cervix and filled with 60 mL fluid.  Following FB placement, cervix 1 cm. As balloon was inflating, SROM occurred, fluid clear  FHTs:  140s, moderate variability, category 1 Toco:  uterine irritability  Growth [place] 7/23 at 36 weeks--EFW 7lb 4oz (80%) Vertex by limited bedside ultrasound on admission  A/P   32 y.o. G2P1001 [redacted]w[redacted]d presents for post-term induction of labor in the setting of a prior cesarean delivery  Long discussion with patient and partner re: r/b/a of TOLAC vs elective RCS.  Successful VBAC would mean shorter postpartum stay and decreased risk of bleeding. Risks of TOLAC include risk of uterine rupture <1%; this may result in emergent section, possible transfusion and even cesarean-hysterectomy. Risks of surgery include infection of the uterus, pelvic organs, or skin, inadvertent injury to internal organs, such as bowel or bladder. If there is major injury, extensive surgery may be required. Discussed possibility of excessive blood loss and transfusion. We  discussed that she based on the EFW at 36 weeks, current EFW is around 9 lb.  In addition, her cervix remains quite unfavorable at closed on admission.  All of these factors mean a lower chance of success.  She verbalizes understanding and would like to proceed.  Foley balloon placed.  Will do low dose pitocin while foley balloon placed.  Following FB placement, SROM with clear fluid occurred.  Gbs negative  Morgan Townsend GEFFEL Morgan Townsend

## 2023-04-12 ENCOUNTER — Encounter (HOSPITAL_COMMUNITY): Payer: Self-pay | Admitting: Obstetrics and Gynecology

## 2023-04-12 ENCOUNTER — Inpatient Hospital Stay (HOSPITAL_COMMUNITY): Payer: BC Managed Care – PPO | Admitting: Anesthesiology

## 2023-04-12 ENCOUNTER — Encounter (HOSPITAL_COMMUNITY)
Admission: AD | Disposition: A | Discharge status: Home / Self Care | Payer: Self-pay | Source: Home / Self Care | Attending: Obstetrics

## 2023-04-12 ENCOUNTER — Other Ambulatory Visit: Payer: Self-pay

## 2023-04-12 DIAGNOSIS — O34211 Maternal care for low transverse scar from previous cesarean delivery: Secondary | ICD-10-CM

## 2023-04-12 DIAGNOSIS — Z3A40 40 weeks gestation of pregnancy: Secondary | ICD-10-CM

## 2023-04-12 LAB — RPR: RPR Ser Ql: NONREACTIVE

## 2023-04-12 SURGERY — Surgical Case
Anesthesia: Epidural | Site: Abdomen

## 2023-04-12 MED ORDER — SODIUM CHLORIDE 0.9% FLUSH: 3.0000 mL | INTRAVENOUS | Status: DC | PRN

## 2023-04-12 MED ORDER — FENTANYL-BUPIVACAINE-NACL 0.5-0.125-0.9 MG/250ML-% EP SOLN
12.0000 mL/h | EPIDURAL | Status: DC | PRN
  Administered 2023-04-12: 12 mL/h via EPIDURAL
  Filled 2023-04-12: qty 250

## 2023-04-12 MED ORDER — CEFAZOLIN SODIUM-DEXTROSE 2-3 GM-%(50ML) IV SOLR: INTRAVENOUS | Status: DC | PRN

## 2023-04-12 MED ORDER — EPHEDRINE 5 MG/ML INJ: 10.0000 mg | INTRAVENOUS | Status: DC | PRN

## 2023-04-12 MED ORDER — DROPERIDOL 2.5 MG/ML IJ SOLN
INTRAMUSCULAR | Status: DC | PRN
Start: 2023-04-12 — End: 2023-04-12
  Administered 2023-04-12: .625 mg via INTRAVENOUS

## 2023-04-12 MED ORDER — ONDANSETRON HCL 4 MG/2ML IJ SOLN
4.0000 mg | Freq: Three times a day (TID) | INTRAMUSCULAR | Status: DC | PRN
  Administered 2023-04-12: 4 mg via INTRAVENOUS
  Filled 2023-04-12: qty 2

## 2023-04-12 MED ORDER — OXYTOCIN-SODIUM CHLORIDE 30-0.9 UT/500ML-% IV SOLN
INTRAVENOUS | Status: DC | PRN
  Administered 2023-04-12: 300 mL via INTRAVENOUS

## 2023-04-12 MED ORDER — ACETAMINOPHEN 500 MG PO TABS: 1000.0000 mg | ORAL_TABLET | Freq: Four times a day (QID) | ORAL | Status: DC

## 2023-04-12 MED ORDER — SIMETHICONE 80 MG PO CHEW: 80.0000 mg | CHEWABLE_TABLET | ORAL | Status: DC | PRN

## 2023-04-12 MED ORDER — MENTHOL 3 MG MT LOZG: 1.0000 | LOZENGE | OROMUCOSAL | Status: DC | PRN

## 2023-04-12 MED ORDER — LIDOCAINE-EPINEPHRINE (PF) 2 %-1:200000 IJ SOLN
INTRAMUSCULAR | Status: DC | PRN
  Administered 2023-04-12: 5 mL via EPIDURAL
  Administered 2023-04-12: 3 mL via EPIDURAL
  Administered 2023-04-12: 2 mL via EPIDURAL

## 2023-04-12 MED ORDER — KETOROLAC TROMETHAMINE 30 MG/ML IJ SOLN: 30.0000 mg | Freq: Once | INTRAMUSCULAR | Status: DC

## 2023-04-12 MED ORDER — DROPERIDOL 2.5 MG/ML IJ SOLN: 0.6250 mg | Freq: Once | INTRAMUSCULAR | Status: DC | PRN

## 2023-04-12 MED ORDER — FAMOTIDINE 20 MG PO TABS
20.0000 mg | ORAL_TABLET | Freq: Two times a day (BID) | ORAL | Status: DC
  Administered 2023-04-12: 20 mg via ORAL
  Filled 2023-04-12: qty 1

## 2023-04-12 MED ORDER — CEFAZOLIN SODIUM-DEXTROSE 2-4 GM/100ML-% IV SOLN
2.0000 g | Freq: Once | INTRAVENOUS | Status: AC
  Administered 2023-04-12: 2 g via INTRAVENOUS

## 2023-04-12 MED ORDER — WITCH HAZEL-GLYCERIN EX PADS: 1.0000 | MEDICATED_PAD | CUTANEOUS | Status: DC | PRN

## 2023-04-12 MED ORDER — OXYCODONE HCL 5 MG PO TABS: 5.0000 mg | ORAL_TABLET | ORAL | Status: DC | PRN

## 2023-04-12 MED ORDER — FENTANYL CITRATE (PF) 100 MCG/2ML IJ SOLN
INTRAMUSCULAR | Status: DC | PRN
  Administered 2023-04-12: 100 ug via EPIDURAL

## 2023-04-12 MED ORDER — COCONUT OIL OIL: 1.0000 | TOPICAL_OIL | Status: DC | PRN

## 2023-04-12 MED ORDER — PHENYLEPHRINE 80 MCG/ML (10ML) SYRINGE FOR IV PUSH (FOR BLOOD PRESSURE SUPPORT)
80.0000 ug | PREFILLED_SYRINGE | INTRAVENOUS | Status: DC | PRN
  Filled 2023-04-12: qty 10

## 2023-04-12 MED ORDER — SODIUM CHLORIDE 0.9 % IV SOLN
25.0000 mg | Freq: Four times a day (QID) | INTRAVENOUS | Status: DC | PRN
  Administered 2023-04-12: 25 mg via INTRAVENOUS
  Filled 2023-04-12: qty 1

## 2023-04-12 MED ORDER — MORPHINE SULFATE (PF) 0.5 MG/ML IJ SOLN
INTRAMUSCULAR | Status: DC | PRN
  Administered 2023-04-12: 3 mg via EPIDURAL

## 2023-04-12 MED ORDER — DIPHENHYDRAMINE HCL 50 MG/ML IJ SOLN: 12.5000 mg | INTRAMUSCULAR | Status: DC | PRN

## 2023-04-12 MED ORDER — DIBUCAINE (PERIANAL) 1 % EX OINT: 1.0000 | TOPICAL_OINTMENT | CUTANEOUS | Status: DC | PRN

## 2023-04-12 MED ORDER — STERILE WATER FOR IRRIGATION IR SOLN
Status: DC | PRN
  Administered 2023-04-12: 1000 mL

## 2023-04-12 MED ORDER — DROPERIDOL 2.5 MG/ML IJ SOLN
INTRAMUSCULAR | Status: AC
  Filled 2023-04-12: qty 2

## 2023-04-12 MED ORDER — FENTANYL CITRATE (PF) 100 MCG/2ML IJ SOLN: 25.0000 ug | INTRAMUSCULAR | Status: DC | PRN

## 2023-04-12 MED ORDER — DEXMEDETOMIDINE HCL IN NACL 80 MCG/20ML IV SOLN
INTRAVENOUS | Status: DC | PRN
Start: 2023-04-12 — End: 2023-04-12
  Administered 2023-04-12: 8 ug via INTRAVENOUS

## 2023-04-12 MED ORDER — PHENYLEPHRINE 80 MCG/ML (10ML) SYRINGE FOR IV PUSH (FOR BLOOD PRESSURE SUPPORT): 80.0000 ug | PREFILLED_SYRINGE | INTRAVENOUS | Status: DC | PRN

## 2023-04-12 MED ORDER — LIDOCAINE HCL (PF) 1 % IJ SOLN
INTRAMUSCULAR | Status: DC | PRN
  Administered 2023-04-12 (×2): 4 mL via EPIDURAL

## 2023-04-12 MED ORDER — NALOXONE HCL 4 MG/10ML IJ SOLN: 1.0000 ug/kg/h | INTRAVENOUS | Status: DC | PRN

## 2023-04-12 MED ORDER — KETOROLAC TROMETHAMINE 30 MG/ML IJ SOLN
30.0000 mg | Freq: Four times a day (QID) | INTRAMUSCULAR | Status: AC
  Administered 2023-04-12 (×2): 30 mg via INTRAVENOUS
  Filled 2023-04-12 (×2): qty 1

## 2023-04-12 MED ORDER — DEXAMETHASONE SODIUM PHOSPHATE 10 MG/ML IJ SOLN
INTRAMUSCULAR | Status: DC | PRN
  Administered 2023-04-12: 4 mg via INTRAVENOUS

## 2023-04-12 MED ORDER — DIPHENHYDRAMINE HCL 25 MG PO CAPS
25.0000 mg | ORAL_CAPSULE | Freq: Four times a day (QID) | ORAL | Status: DC | PRN
  Administered 2023-04-12 (×2): 25 mg via ORAL
  Filled 2023-04-12 (×3): qty 1

## 2023-04-12 MED ORDER — SCOPOLAMINE 1 MG/3DAYS TD PT72
1.0000 | MEDICATED_PATCH | TRANSDERMAL | Status: DC
  Administered 2023-04-12: 1.5 mg via TRANSDERMAL
  Filled 2023-04-12: qty 1

## 2023-04-12 MED ORDER — LACTATED RINGERS IV SOLN: INTRAVENOUS | Status: DC

## 2023-04-12 MED ORDER — ONDANSETRON HCL 4 MG/2ML IJ SOLN
INTRAMUSCULAR | Status: DC | PRN
  Administered 2023-04-12: 4 mg via INTRAVENOUS

## 2023-04-12 MED ORDER — PHENYLEPHRINE 80 MCG/ML (10ML) SYRINGE FOR IV PUSH (FOR BLOOD PRESSURE SUPPORT)
PREFILLED_SYRINGE | INTRAVENOUS | Status: AC
  Filled 2023-04-12: qty 10

## 2023-04-12 MED ORDER — ACETAMINOPHEN 500 MG PO TABS
1000.0000 mg | ORAL_TABLET | Freq: Four times a day (QID) | ORAL | Status: DC
  Administered 2023-04-12 – 2023-04-13 (×4): 1000 mg via ORAL
  Filled 2023-04-12 (×4): qty 2

## 2023-04-12 MED ORDER — PRENATAL MULTIVITAMIN CH
1.0000 | ORAL_TABLET | Freq: Every day | ORAL | Status: DC
  Administered 2023-04-12 – 2023-04-13 (×2): 1 via ORAL
  Filled 2023-04-12 (×2): qty 1

## 2023-04-12 MED ORDER — KETOROLAC TROMETHAMINE 30 MG/ML IJ SOLN: 30.0000 mg | Freq: Four times a day (QID) | INTRAMUSCULAR | Status: AC | PRN

## 2023-04-12 MED ORDER — MORPHINE SULFATE (PF) 0.5 MG/ML IJ SOLN
INTRAMUSCULAR | Status: AC
  Filled 2023-04-12: qty 10

## 2023-04-12 MED ORDER — IBUPROFEN 600 MG PO TABS
600.0000 mg | ORAL_TABLET | Freq: Four times a day (QID) | ORAL | Status: DC
  Administered 2023-04-12 – 2023-04-13 (×3): 600 mg via ORAL
  Filled 2023-04-12 (×3): qty 1

## 2023-04-12 MED ORDER — NALOXONE HCL 0.4 MG/ML IJ SOLN: 0.4000 mg | INTRAMUSCULAR | Status: DC | PRN

## 2023-04-12 MED ORDER — LACTATED RINGERS IV SOLN: 500.0000 mL | Freq: Once | INTRAVENOUS | Status: DC

## 2023-04-12 MED ORDER — DIPHENHYDRAMINE HCL 25 MG PO CAPS: 25.0000 mg | ORAL_CAPSULE | ORAL | Status: DC | PRN

## 2023-04-12 MED ORDER — SODIUM CHLORIDE 0.9 % IV SOLN
500.0000 mg | Freq: Once | INTRAVENOUS | Status: AC
  Administered 2023-04-12: 500 mg via INTRAVENOUS

## 2023-04-12 MED ORDER — SENNOSIDES-DOCUSATE SODIUM 8.6-50 MG PO TABS
2.0000 | ORAL_TABLET | ORAL | Status: DC
  Administered 2023-04-12 – 2023-04-13 (×2): 2 via ORAL
  Filled 2023-04-12 (×2): qty 2

## 2023-04-12 MED ORDER — PHENYLEPHRINE 80 MCG/ML (10ML) SYRINGE FOR IV PUSH (FOR BLOOD PRESSURE SUPPORT)
PREFILLED_SYRINGE | INTRAVENOUS | Status: DC | PRN
  Administered 2023-04-12 (×4): 160 ug via INTRAVENOUS

## 2023-04-12 MED ORDER — OXYTOCIN-SODIUM CHLORIDE 30-0.9 UT/500ML-% IV SOLN
2.5000 [IU]/h | INTRAVENOUS | Status: AC
  Administered 2023-04-12: 2.5 [IU]/h via INTRAVENOUS
  Filled 2023-04-12: qty 500

## 2023-04-12 MED ORDER — SIMETHICONE 80 MG PO CHEW
80.0000 mg | CHEWABLE_TABLET | Freq: Three times a day (TID) | ORAL | Status: DC
  Administered 2023-04-12 – 2023-04-13 (×2): 80 mg via ORAL
  Filled 2023-04-12 (×5): qty 1

## 2023-04-12 MED ORDER — ACETAMINOPHEN 10 MG/ML IV SOLN
INTRAVENOUS | Status: DC | PRN
Start: 2023-04-12 — End: 2023-04-12
  Administered 2023-04-12: 1000 mg via INTRAVENOUS

## 2023-04-12 SURGICAL SUPPLY — 40 items
APL PRP STRL LF DISP 70% ISPRP (MISCELLANEOUS) ×2
APL SKNCLS STERI-STRIP NONHPOA (GAUZE/BANDAGES/DRESSINGS) ×1
BENZOIN TINCTURE PRP APPL 2/3 (GAUZE/BANDAGES/DRESSINGS) ×1 IMPLANT
CHLORAPREP W/TINT 26 (MISCELLANEOUS) ×2 IMPLANT
CLAMP UMBILICAL CORD (MISCELLANEOUS) ×1 IMPLANT
CLOTH BEACON ORANGE TIMEOUT ST (SAFETY) ×1 IMPLANT
DRSG OPSITE POSTOP 4X10 (GAUZE/BANDAGES/DRESSINGS) ×1 IMPLANT
ELECT REM PT RETURN 9FT ADLT (ELECTROSURGICAL) ×1
ELECTRODE REM PT RTRN 9FT ADLT (ELECTROSURGICAL) ×1 IMPLANT
EXTRACTOR VACUUM KIWI (MISCELLANEOUS) IMPLANT
GAUZE PAD ABD 8X10 STRL (GAUZE/BANDAGES/DRESSINGS) IMPLANT
GAUZE SPONGE 4X4 12PLY STRL LF (GAUZE/BANDAGES/DRESSINGS) IMPLANT
GLOVE BIOGEL M STER SZ 6 (GLOVE) ×1 IMPLANT
GLOVE BIOGEL PI IND STRL 6.5 (GLOVE) ×1 IMPLANT
GLOVE BIOGEL PI IND STRL 7.0 (GLOVE) ×1 IMPLANT
GOWN STRL REUS W/TWL LRG LVL3 (GOWN DISPOSABLE) ×2 IMPLANT
KIT ABG SYR 3ML LUER SLIP (SYRINGE) IMPLANT
MAT PREVALON FULL STRYKER (MISCELLANEOUS) IMPLANT
NDL HYPO 25X5/8 SAFETYGLIDE (NEEDLE) IMPLANT
NEEDLE HYPO 25X5/8 SAFETYGLIDE (NEEDLE) IMPLANT
NS IRRIG 1000ML POUR BTL (IV SOLUTION) ×1 IMPLANT
PACK C SECTION WH (CUSTOM PROCEDURE TRAY) ×1 IMPLANT
PAD OB MATERNITY 4.3X12.25 (PERSONAL CARE ITEMS) ×1 IMPLANT
RTRCTR C-SECT PINK 25CM LRG (MISCELLANEOUS) ×1 IMPLANT
STRIP CLOSURE SKIN 1/2X4 (GAUZE/BANDAGES/DRESSINGS) ×1 IMPLANT
SUT MNCRL 0 VIOLET CTX 36 (SUTURE) ×2 IMPLANT
SUT PDS AB 0 CTX 60 (SUTURE) IMPLANT
SUT PLAIN 0 NONE (SUTURE) IMPLANT
SUT PLAIN 2 0 (SUTURE) ×1
SUT PLAIN ABS 2-0 CT1 27XMFL (SUTURE) ×1 IMPLANT
SUT VIC AB 0 CTX 36 (SUTURE) ×2
SUT VIC AB 0 CTX36XBRD ANBCTRL (SUTURE) ×2 IMPLANT
SUT VIC AB 2-0 CT1 27 (SUTURE) ×1
SUT VIC AB 2-0 CT1 TAPERPNT 27 (SUTURE) ×1 IMPLANT
SUT VIC AB 4-0 PS2 27 (SUTURE) ×1 IMPLANT
SUT VICRYL 4-0 PS2 18IN ABS (SUTURE) ×1 IMPLANT
TAPE PAPER 1X10 WHT MICROPORE (GAUZE/BANDAGES/DRESSINGS) IMPLANT
TOWEL OR 17X24 6PK STRL BLUE (TOWEL DISPOSABLE) ×1 IMPLANT
TRAY FOLEY W/BAG SLVR 14FR LF (SET/KITS/TRAYS/PACK) ×1 IMPLANT
WATER STERILE IRR 1000ML POUR (IV SOLUTION) ×1 IMPLANT

## 2023-04-12 NOTE — Progress Notes (Signed)
No improvement in fetal heart rate tracing despite resuscitative measures.  Continued recurrent decelerations on only 1 mU of pitocin with inadequate MVUs.  Variability has remained minimal.  Given that she is remote from delivery and we cannot safely increase the pitocin at this time, I recommend proceeding to the OR for a repeat cesarean section.  We discussed the risks to cesarean section to include infection, bleeding, damage to surrounding structures (including but not limited to bowel, bladder, tubes, ovaries, nerves, vessels, baby), need for blood transfusion, venous thromboembolism, need for additional procedures such as cesarean hysterectomy.  Ancef and azithromycin ordered.  Consent signed

## 2023-04-12 NOTE — Progress Notes (Signed)
Patient seen and examined.  FB out around 0330.  Alerted by RN that fetal heart rate variability has decreased and she is having more frequent fetal heart rate decelerations  BP 103/88   Pulse 79   Temp 98.3 F (36.8 C) (Oral)   Resp 16   Ht 5\' 6"  (1.676 m)   Wt 85.3 kg   LMP 06/22/2022   SpO2 98%   BMI 30.34 kg/m   Toco: q3-4 minutes, pitocin at 1 mU EFM: 150s, minimal variability.  + scalp stimulation noted with placement of IUPC, then deep variable to the 90s with position to change.  Decelerations have become more frequent over the last 1-2 hours and variable has notably decreased. SVE: unchanged at 4/80/-2    Plan: G2P1 @ [redacted]w[redacted]d w post-term pregnancy, IOL in setting of prior cesarean delivery IUPC placed to better characterize timing of fetal heart rate decelerations.  + scalp stimulation noted.  Will continue position change / resuscitative measures.  If no response, would recommend proceeding to the operative room for repeat cesarean section

## 2023-04-12 NOTE — Progress Notes (Signed)
Patient complains of nausea and vomiting X3. Zofran was given followed by a scope patch and phenergan. States she feels unable to perform orthostatic blood pressures and ambulate at this time, saltine crackers, and ice chip were given. Patient encouraged to drink water and progress diet as she feels able.

## 2023-04-12 NOTE — Plan of Care (Signed)
Problem: Education: Goal: Knowledge of Childbirth will improve 04/12/2023 0953 by Donne Hazel, LPN Outcome: Progressing 04/12/2023 0945 by Donne Hazel, LPN Outcome: Progressing Goal: Ability to make informed decisions regarding treatment and plan of care will improve 04/12/2023 0953 by Donne Hazel, LPN Outcome: Progressing 04/12/2023 0945 by Donne Hazel, LPN Outcome: Progressing Goal: Ability to state and carry out methods to decrease the pain will improve 04/12/2023 0953 by Donne Hazel, LPN Outcome: Progressing 04/12/2023 0945 by Donne Hazel, LPN Outcome: Progressing Goal: Individualized Educational Video(s) 04/12/2023 0953 by Donne Hazel, LPN Outcome: Progressing 04/12/2023 0945 by Donne Hazel, LPN Outcome: Progressing   Problem: Coping: Goal: Ability to verbalize concerns and feelings about labor and delivery will improve 04/12/2023 0953 by Donne Hazel, LPN Outcome: Progressing 04/12/2023 0945 by Donne Hazel, LPN Outcome: Progressing   Problem: Life Cycle: Goal: Ability to make normal progression through stages of labor will improve 04/12/2023 0953 by Donne Hazel, LPN Outcome: Progressing 04/12/2023 0945 by Donne Hazel, LPN Outcome: Progressing Goal: Ability to effectively push during vaginal delivery will improve 04/12/2023 0953 by Donne Hazel, LPN Outcome: Progressing 04/12/2023 0945 by Donne Hazel, LPN Outcome: Progressing   Problem: Role Relationship: Goal: Will demonstrate positive interactions with the child 04/12/2023 0953 by Donne Hazel, LPN Outcome: Progressing 04/12/2023 0945 by Donne Hazel, LPN Outcome: Progressing   Problem: Safety: Goal: Risk of complications during labor and delivery will decrease 04/12/2023 0953 by Donne Hazel, LPN Outcome: Progressing 04/12/2023 0945 by Donne Hazel, LPN Outcome: Progressing   Problem: Pain Management: Goal: Relief or control of pain from uterine contractions will  improve 04/12/2023 0953 by Donne Hazel, LPN Outcome: Progressing 04/12/2023 0945 by Donne Hazel, LPN Outcome: Progressing   Problem: Education: Goal: Knowledge of General Education information will improve Description: Including pain rating scale, medication(s)/side effects and non-pharmacologic comfort measures 04/12/2023 0953 by Donne Hazel, LPN Outcome: Progressing 04/12/2023 0945 by Donne Hazel, LPN Outcome: Progressing   Problem: Health Behavior/Discharge Planning: Goal: Ability to manage health-related needs will improve 04/12/2023 0953 by Donne Hazel, LPN Outcome: Progressing 04/12/2023 0945 by Donne Hazel, LPN Outcome: Progressing   Problem: Clinical Measurements: Goal: Ability to maintain clinical measurements within normal limits will improve 04/12/2023 0953 by Donne Hazel, LPN Outcome: Progressing 04/12/2023 0945 by Donne Hazel, LPN Outcome: Progressing Goal: Will remain free from infection 04/12/2023 0953 by Donne Hazel, LPN Outcome: Progressing 04/12/2023 0945 by Donne Hazel, LPN Outcome: Progressing Goal: Diagnostic test results will improve 04/12/2023 0953 by Donne Hazel, LPN Outcome: Progressing 04/12/2023 0945 by Donne Hazel, LPN Outcome: Progressing Goal: Respiratory complications will improve 04/12/2023 0953 by Donne Hazel, LPN Outcome: Progressing 04/12/2023 0945 by Donne Hazel, LPN Outcome: Progressing Goal: Cardiovascular complication will be avoided 04/12/2023 0953 by Donne Hazel, LPN Outcome: Progressing 04/12/2023 0945 by Donne Hazel, LPN Outcome: Progressing   Problem: Activity: Goal: Risk for activity intolerance will decrease 04/12/2023 0953 by Donne Hazel, LPN Outcome: Progressing 04/12/2023 0945 by Donne Hazel, LPN Outcome: Progressing   Problem: Nutrition: Goal: Adequate nutrition will be maintained 04/12/2023 0953 by Donne Hazel, LPN Outcome: Progressing 04/12/2023 0945 by Donne Hazel,  LPN Outcome: Progressing   Problem: Coping: Goal: Level of anxiety will decrease 04/12/2023 0953 by Donne Hazel, LPN Outcome: Progressing 04/12/2023 0945 by Donne Hazel, LPN Outcome: Progressing   Problem: Elimination:  Goal: Will not experience complications related to bowel motility 04/12/2023 0953 by Donne Hazel, LPN Outcome: Progressing 04/12/2023 0945 by Donne Hazel, LPN Outcome: Progressing Goal: Will not experience complications related to urinary retention 04/12/2023 0953 by Donne Hazel, LPN Outcome: Progressing 04/12/2023 0945 by Donne Hazel, LPN Outcome: Progressing   Problem: Pain Managment: Goal: General experience of comfort will improve 04/12/2023 0953 by Donne Hazel, LPN Outcome: Progressing 04/12/2023 0945 by Donne Hazel, LPN Outcome: Progressing   Problem: Safety: Goal: Ability to remain free from injury will improve 04/12/2023 0953 by Donne Hazel, LPN Outcome: Progressing 04/12/2023 0945 by Donne Hazel, LPN Outcome: Progressing   Problem: Skin Integrity: Goal: Risk for impaired skin integrity will decrease 04/12/2023 0953 by Donne Hazel, LPN Outcome: Progressing 04/12/2023 0945 by Donne Hazel, LPN Outcome: Progressing   Problem: Education: Goal: Knowledge of the prescribed therapeutic regimen will improve 04/12/2023 0953 by Donne Hazel, LPN Outcome: Progressing 04/12/2023 0945 by Donne Hazel, LPN Outcome: Progressing Goal: Understanding of sexual limitations or changes related to disease process or condition will improve 04/12/2023 0953 by Donne Hazel, LPN Outcome: Progressing 04/12/2023 0945 by Donne Hazel, LPN Outcome: Progressing Goal: Individualized Educational Video(s) 04/12/2023 0953 by Donne Hazel, LPN Outcome: Progressing 04/12/2023 0945 by Donne Hazel, LPN Outcome: Progressing   Problem: Self-Concept: Goal: Communication of feelings regarding changes in body function or appearance will  improve 04/12/2023 0953 by Donne Hazel, LPN Outcome: Progressing 04/12/2023 0945 by Donne Hazel, LPN Outcome: Progressing   Problem: Skin Integrity: Goal: Demonstration of wound healing without infection will improve 04/12/2023 0953 by Donne Hazel, LPN Outcome: Progressing 04/12/2023 0945 by Donne Hazel, LPN Outcome: Progressing   Problem: Education: Goal: Knowledge of condition will improve 04/12/2023 0953 by Donne Hazel, LPN Outcome: Progressing 04/12/2023 0945 by Donne Hazel, LPN Outcome: Progressing Goal: Individualized Educational Video(s) 04/12/2023 0953 by Donne Hazel, LPN Outcome: Progressing 04/12/2023 0945 by Donne Hazel, LPN Outcome: Progressing Goal: Individualized Newborn Educational Video(s) 04/12/2023 0953 by Donne Hazel, LPN Outcome: Progressing 04/12/2023 0945 by Donne Hazel, LPN Outcome: Progressing   Problem: Activity: Goal: Will verbalize the importance of balancing activity with adequate rest periods 04/12/2023 0953 by Donne Hazel, LPN Outcome: Progressing 04/12/2023 0945 by Donne Hazel, LPN Outcome: Progressing Goal: Ability to tolerate increased activity will improve 04/12/2023 0953 by Donne Hazel, LPN Outcome: Progressing 04/12/2023 0945 by Donne Hazel, LPN Outcome: Progressing   Problem: Coping: Goal: Ability to identify and utilize available resources and services will improve 04/12/2023 0953 by Donne Hazel, LPN Outcome: Progressing 04/12/2023 0945 by Donne Hazel, LPN Outcome: Progressing   Problem: Life Cycle: Goal: Chance of risk for complications during the postpartum period will decrease 04/12/2023 0953 by Donne Hazel, LPN Outcome: Progressing 04/12/2023 0945 by Donne Hazel, LPN Outcome: Progressing   Problem: Role Relationship: Goal: Ability to demonstrate positive interaction with newborn will improve 04/12/2023 0953 by Donne Hazel, LPN Outcome: Progressing 04/12/2023 0945 by Donne Hazel, LPN Outcome: Progressing   Problem: Skin Integrity: Goal: Demonstration of wound healing without infection will improve 04/12/2023 0953 by Donne Hazel, LPN Outcome: Progressing 04/12/2023 0945 by Donne Hazel, LPN Outcome: Progressing

## 2023-04-12 NOTE — Progress Notes (Signed)
[facility] consent signed and witnessed.

## 2023-04-12 NOTE — Progress Notes (Signed)
Subjective: Postpartum Day 0: Cesarean Delivery Patient reports incisional pain, tolerating PO, and + flatus.  Pain controlled with meds. She has a foley catheter in place. She denies HA, CP, SOB or lightheadedness. She is bonding with baby boy Virgel Bouquet - does not desire a circumcision for him. No complaints at this time   Objective: Vital signs in last 24 hours: Temp:  [97.7 F (36.5 C)-99 F (37.2 C)] 98.1 F (36.7 C) (08/26 1015) Pulse Rate:  [74-105] 78 (08/26 1015) Resp:  [14-18] 18 (08/26 1015) BP: (89-140)/(44-88) 107/64 (08/26 1015) SpO2:  [96 %-100 %] 100 % (08/26 1015) Weight:  [85.3 kg] 85.3 kg (08/25 1810)  Physical Exam:  General: alert, cooperative, and no distress Lochia: appropriate Uterine Fundus: firm Incision: no significant drainage DVT Evaluation: No evidence of DVT seen on physical exam. No significant calf/ankle edema.  Recent Labs    04/11/23 1827  HGB 12.9  HCT 37.4    Assessment/Plan: Status post Cesarean section. Doing well postoperatively.  Continue current care.  Morgan Muster, DO 04/12/2023, 10:48 AM

## 2023-04-12 NOTE — Op Note (Signed)
Cesarean Section Procedure Note  Pre-operative Diagnosis: 1. Intrauterine pregnancy at [redacted]w[redacted]d  2. History of cesarean section  3. Non-reassuring fetal status  Post-operative Diagnosis: same as above  Surgeon: Marlow Baars, MD  Assistants: Wylene Simmer, MD  Procedure: Repeat low transverse cesarean section   Anesthesia: Epidural anesthesia  Estimated Blood Loss: 388 mL         Drains: Foley catheter         Specimens: placenta to pathology              Complications:  None; patient tolerated the procedure well.         Disposition: PACU - hemodynamically stable.  Findings:  Normal uterus, tubes and ovaries bilaterally.  Viable female infant, asynclitic in the OP position with triple nuchal cord, 3860 g (8lb 8.2oz) Apgars 9, 9.    Procedure Details   After epidural  anesthesia was found to adequate, the patient was placed in the dorsal supine position with a leftward tilt, prepped and draped in the usual sterile manner.   Her prior scar curved upward laterally to the right abdomen higher than expected at an odd angle, but she requested to use the same scar and not create a new scar.  A Pfannenstiel incision was made and carried down through the subcutaneous tissue to the fascia.  The fascia was incised in the midline and the fascial incision was extended laterally with Mayo scissors. The superior aspect of the fascial incision was grasped with two Kocher clamps, tented up and the rectus muscles dissected off sharply. The rectus was then dissected off with blunt dissection and Mayo scissors inferiorly. The rectus muscles were separated in the midline. The abdominal peritoneum was identified, tented up, entered bluntly, and the incision was extended superiorly and inferiorly with good visualization of the bladder. The Alexis retractor was deployed. The vesicouterine peritoneum was identified, tented up, entered sharply, and the bladder flap was created digitally. A scalpel was then used to  make a low transverse incision on the uterus which was extended in the cephalad-caudad direction with blunt dissection.  The lower uterine segment was thin.  The fluid was clear. The fetal vertex was identified and was asynclitic in the OP position.  It was wedged in the pelvis despite minimal descent.  It was elevated out of the pelvis and brought to the hysterotomy.  The head was delivered easily followed by the shoulders and body.  After a 60 second delay per protocol, the cord was clamped and cut and the infant was passed to the waiting neonatologist.  The placenta was then delivered spontaneously, intact and appear normal, the uterus was cleared of all clot and debris   The hysterotomy was repaired with #0 Monocryl in running locked fashion.  There was a small extension to the left that was easily incorporated into the first layer closure. A second imbricating layer of #0 Monocryl was placed.  Excellent hemostasis was noted.  The Alexis retractor was removed from the abdomen. The peritoneum was examined and all vessels noted to be hemostatic. The abdominal cavity was cleared of all clot and debris.  The peritoneum was closed with 2-0 vicryl in a running fashion.  The fascia and rectus muscles were inspected and were hemostatic. The fascia was closed with 0 Vicryl in a running fashion. The subcutaneous layer was irrigated and all bleeders cauterized. The subcutaneous layer was closed with interrupted plain gut. The skin was closed with 3-0 monocryl in a subcuticular fashion. The incision  was dressed with benzoine, steri strips and honeycomb dressing.  A pressure dressing was placed. All sponge lap and needle counts were correct x3. Patient tolerated the procedure well and recovered in stable condition following the procedure.   An experienced assistant was required given the standard of surgical care given the complexity of the case.  This assistant was needed for exposure, dissection, suctioning,  retraction, instrument exchange, assisting with delivery with administration of fundal pressure, and for overall help during the procedure.

## 2023-04-12 NOTE — Transfer of Care (Signed)
Immediate Anesthesia Transfer of Care Note  Patient: Morgan Townsend  Procedure(s) Performed: CESAREAN SECTION (Abdomen)  Patient Location: PACU  Anesthesia Type:Spinal  Level of Consciousness: awake  Airway & Oxygen Therapy: Patient Spontanous Breathing  Post-op Assessment: Report given to RN  Post vital signs: Reviewed and stable  Last Vitals:  Vitals Value Taken Time  BP 98/53 04/12/23 0803  Temp    Pulse 94 04/12/23 0805  Resp 18 04/12/23 0805  SpO2 100 % 04/12/23 0805  Vitals shown include unfiled device data.  Last Pain:  Vitals:   04/12/23 0500  TempSrc: Oral  PainSc:          Complications: No notable events documented.

## 2023-04-12 NOTE — Plan of Care (Signed)
  Problem: Education: Goal: Knowledge of Childbirth will improve Outcome: Progressing Goal: Ability to make informed decisions regarding treatment and plan of care will improve Outcome: Progressing Goal: Ability to state and carry out methods to decrease the pain will improve Outcome: Progressing Goal: Individualized Educational Video(s) Outcome: Progressing   Problem: Coping: Goal: Ability to verbalize concerns and feelings about labor and delivery will improve Outcome: Progressing   Problem: Life Cycle: Goal: Ability to make normal progression through stages of labor will improve Outcome: Progressing Goal: Ability to effectively push during vaginal delivery will improve Outcome: Progressing   Problem: Role Relationship: Goal: Will demonstrate positive interactions with the child Outcome: Progressing   Problem: Safety: Goal: Risk of complications during labor and delivery will decrease Outcome: Progressing   Problem: Pain Management: Goal: Relief or control of pain from uterine contractions will improve Outcome: Progressing   Problem: Education: Goal: Knowledge of General Education information will improve Description: Including pain rating scale, medication(s)/side effects and non-pharmacologic comfort measures Outcome: Progressing   Problem: Health Behavior/Discharge Planning: Goal: Ability to manage health-related needs will improve Outcome: Progressing   Problem: Clinical Measurements: Goal: Ability to maintain clinical measurements within normal limits will improve Outcome: Progressing Goal: Will remain free from infection Outcome: Progressing Goal: Diagnostic test results will improve Outcome: Progressing Goal: Respiratory complications will improve Outcome: Progressing Goal: Cardiovascular complication will be avoided Outcome: Progressing   Problem: Activity: Goal: Risk for activity intolerance will decrease Outcome: Progressing   Problem:  Nutrition: Goal: Adequate nutrition will be maintained Outcome: Progressing   Problem: Coping: Goal: Level of anxiety will decrease Outcome: Progressing   Problem: Elimination: Goal: Will not experience complications related to bowel motility Outcome: Progressing Goal: Will not experience complications related to urinary retention Outcome: Progressing   Problem: Pain Managment: Goal: General experience of comfort will improve Outcome: Progressing   Problem: Safety: Goal: Ability to remain free from injury will improve Outcome: Progressing   Problem: Skin Integrity: Goal: Risk for impaired skin integrity will decrease Outcome: Progressing   Problem: Education: Goal: Knowledge of the prescribed therapeutic regimen will improve Outcome: Progressing Goal: Understanding of sexual limitations or changes related to disease process or condition will improve Outcome: Progressing Goal: Individualized Educational Video(s) Outcome: Progressing   Problem: Self-Concept: Goal: Communication of feelings regarding changes in body function or appearance will improve Outcome: Progressing   Problem: Skin Integrity: Goal: Demonstration of wound healing without infection will improve Outcome: Progressing   Problem: Education: Goal: Knowledge of condition will improve Outcome: Progressing Goal: Individualized Educational Video(s) Outcome: Progressing Goal: Individualized Newborn Educational Video(s) Outcome: Progressing   Problem: Activity: Goal: Will verbalize the importance of balancing activity with adequate rest periods Outcome: Progressing Goal: Ability to tolerate increased activity will improve Outcome: Progressing   Problem: Coping: Goal: Ability to identify and utilize available resources and services will improve Outcome: Progressing   Problem: Life Cycle: Goal: Chance of risk for complications during the postpartum period will decrease Outcome: Progressing    Problem: Role Relationship: Goal: Ability to demonstrate positive interaction with newborn will improve Outcome: Progressing   Problem: Skin Integrity: Goal: Demonstration of wound healing without infection will improve Outcome: Progressing

## 2023-04-12 NOTE — Anesthesia Preprocedure Evaluation (Addendum)
Anesthesia Evaluation  Patient identified by MRN, date of birth, ID band Patient awake    Reviewed: Allergy & Precautions, NPO status , Patient's Chart, lab work & pertinent test results  History of Anesthesia Complications Negative for: history of anesthetic complications  Airway Mallampati: II  TM Distance: >3 FB Neck ROM: Full    Dental no notable dental hx.    Pulmonary neg pulmonary ROS   Pulmonary exam normal        Cardiovascular negative cardio ROS Normal cardiovascular exam     Neuro/Psych negative neurological ROS     GI/Hepatic negative GI ROS, Neg liver ROS,,,  Endo/Other  negative endocrine ROS    Renal/GU negative Renal ROS     Musculoskeletal negative musculoskeletal ROS (+)    Abdominal   Peds  Hematology negative hematology ROS (+)   Anesthesia Other Findings Day of surgery medications reviewed with patient.  Reproductive/Obstetrics (+) Pregnancy (Hx of C/S x1)                             Anesthesia Physical Anesthesia Plan  ASA: 3 and emergent  Anesthesia Plan: Epidural   Post-op Pain Management: Ofirmev IV (intra-op)*   Induction:   PONV Risk Score and Plan: Treatment may vary due to age or medical condition, Ondansetron and Dexamethasone  Airway Management Planned: Natural Airway  Additional Equipment: Fetal Monitoring  Intra-op Plan:   Post-operative Plan:   Informed Consent: I have reviewed the patients History and Physical, chart, labs and discussed the procedure including the risks, benefits and alternatives for the proposed anesthesia with the patient or authorized representative who has indicated his/her understanding and acceptance.       Plan Discussed with: CRNA  Anesthesia Plan Comments: (Epidural to be used for urgent C/S (NRFHT). Stephannie Peters, MD)       Anesthesia Quick Evaluation

## 2023-04-12 NOTE — Lactation Note (Signed)
This note was copied from a baby's chart. Lactation Consultation Note  Patient Name: Morgan Townsend [id number] Date: 04/12/2023 Age:34 hours Per RN [name]), Birth Parent declined [facility] services tonight and would like to be seen by [facility] in the morning.    Maternal Data    Feeding    LATCH Score                    Lactation Tools Discussed/Used    Interventions    Discharge    Consult Status      [name] 04/12/2023, 11:15 PM

## 2023-04-12 NOTE — Anesthesia Procedure Notes (Signed)
Epidural Patient location during procedure: OB Start time: 04/12/2023 1:49 AM End time: 04/12/2023 1:52 AM  Staffing Anesthesiologist: Kaylyn Layer, MD Performed: anesthesiologist   Preanesthetic Checklist Completed: patient identified, IV checked, risks and benefits discussed, monitors and equipment checked, pre-op evaluation and timeout performed  Epidural Patient position: sitting Prep: DuraPrep and site prepped and draped Patient monitoring: continuous pulse ox, blood pressure and heart rate Approach: midline Location: L3-L4 Injection technique: LOR air  Needle:  Needle type: Tuohy  Needle gauge: 17 G Needle length: 9 cm Needle insertion depth: 4 cm Catheter type: closed end flexible Catheter size: 19 Gauge Catheter at skin depth: 9 cm Test dose: negative and Other (1% lidocaine)  Assessment Events: blood not aspirated, no cerebrospinal fluid, injection not painful, no injection resistance, no paresthesia and negative IV test  Additional Notes Patient identified. Risks, benefits, and alternatives discussed with patient including but not limited to bleeding, infection, nerve damage, paralysis, failed block, incomplete pain control, headache, blood pressure changes, nausea, vomiting, reactions to medication, itching, and postpartum back pain. Confirmed with bedside nurse the patient's most recent platelet count. Confirmed with patient that they are not currently taking any anticoagulation, have any bleeding history, or any family history of bleeding disorders. Patient expressed understanding and wished to proceed. All questions were answered. Sterile technique was used throughout the entire procedure. Please see nursing notes for vital signs.   Crisp LOR on first pass. Test dose was given through epidural catheter and negative prior to continuing to dose epidural or start infusion. Warning signs of high block given to the patient including shortness of breath,  tingling/numbness in hands, complete motor block, or any concerning symptoms with instructions to call for help. Patient was given instructions on fall risk and not to get out of bed. All questions and concerns addressed with instructions to call with any issues or inadequate analgesia.  Reason for block:procedure for pain

## 2023-04-13 ENCOUNTER — Other Ambulatory Visit (HOSPITAL_COMMUNITY): Payer: Self-pay

## 2023-04-13 ENCOUNTER — Other Ambulatory Visit (HOSPITAL_BASED_OUTPATIENT_CLINIC_OR_DEPARTMENT_OTHER): Payer: Self-pay

## 2023-04-13 ENCOUNTER — Inpatient Hospital Stay (HOSPITAL_COMMUNITY): Payer: BC Managed Care – PPO

## 2023-04-13 LAB — CBC
HCT: 34 % — ABNORMAL LOW (ref 36.0–46.0)
Hemoglobin: 11.8 g/dL — ABNORMAL LOW (ref 12.0–15.0)
MCH: 34 pg (ref 26.0–34.0)
MCHC: 34.7 g/dL (ref 30.0–36.0)
MCV: 98 fL (ref 80.0–100.0)
Platelets: 186 10*3/uL (ref 150–400)
RBC: 3.47 MIL/uL — ABNORMAL LOW (ref 3.87–5.11)
RDW: 13 % (ref 11.5–15.5)
WBC: 16 10*3/uL — ABNORMAL HIGH (ref 4.0–10.5)
nRBC: 0 % (ref 0.0–0.2)

## 2023-04-13 MED ORDER — OXYCODONE HCL 5 MG PO TABS
5.0000 mg | ORAL_TABLET | ORAL | 0 refills | Status: AC | PRN
Start: 2023-04-13 — End: ?
  Filled 2023-04-13 (×2): qty 30, 5d supply, fill #0

## 2023-04-13 NOTE — Lactation Note (Signed)
This note was copied from a baby's chart. Lactation Consultation Note  Patient Name: Boy Elizabethanne Budhu [id number] Date: 04/13/2023 Age:34 hours Reason for consult: Follow-up assessment;Term (3 rd visit due to going home early, LC reviewed BF D/C teaching and the [facility] resources.) LC reviewed engorgement prevention and tx .   Maternal Data    Feeding Mother's Current Feeding Choice: Breast Milk   Interventions  Reviewed engorgement prevention and tx   Discharge Discharge Education: Engorgement and breast care Pump: Hands Free;Personal  Consult Status Consult Status: Complete    Matilde Sprang Aydn Ferrara 04/13/2023, 2:13 PM

## 2023-04-13 NOTE — Plan of Care (Signed)
  Problem: Education: Goal: Knowledge of Childbirth will improve Outcome: Progressing Goal: Ability to make informed decisions regarding treatment and plan of care will improve Outcome: Progressing Goal: Ability to state and carry out methods to decrease the pain will improve Outcome: Progressing Goal: Individualized Educational Video(s) Outcome: Progressing   Problem: Coping: Goal: Ability to verbalize concerns and feelings about labor and delivery will improve Outcome: Progressing   Problem: Life Cycle: Goal: Ability to make normal progression through stages of labor will improve Outcome: Progressing Goal: Ability to effectively push during vaginal delivery will improve Outcome: Progressing   Problem: Role Relationship: Goal: Will demonstrate positive interactions with the child Outcome: Progressing   Problem: Safety: Goal: Risk of complications during labor and delivery will decrease Outcome: Progressing   Problem: Pain Management: Goal: Relief or control of pain from uterine contractions will improve Outcome: Progressing   Problem: Education: Goal: Knowledge of General Education information will improve Description: Including pain rating scale, medication(s)/side effects and non-pharmacologic comfort measures Outcome: Progressing   Problem: Health Behavior/Discharge Planning: Goal: Ability to manage health-related needs will improve Outcome: Progressing   Problem: Clinical Measurements: Goal: Ability to maintain clinical measurements within normal limits will improve Outcome: Progressing Goal: Will remain free from infection Outcome: Progressing Goal: Diagnostic test results will improve Outcome: Progressing Goal: Respiratory complications will improve Outcome: Progressing Goal: Cardiovascular complication will be avoided Outcome: Progressing   Problem: Activity: Goal: Risk for activity intolerance will decrease Outcome: Progressing   Problem:  Nutrition: Goal: Adequate nutrition will be maintained Outcome: Progressing   Problem: Coping: Goal: Level of anxiety will decrease Outcome: Progressing   Problem: Elimination: Goal: Will not experience complications related to bowel motility Outcome: Progressing Goal: Will not experience complications related to urinary retention Outcome: Progressing   Problem: Pain Managment: Goal: General experience of comfort will improve Outcome: Progressing   Problem: Safety: Goal: Ability to remain free from injury will improve Outcome: Progressing   Problem: Skin Integrity: Goal: Risk for impaired skin integrity will decrease Outcome: Progressing   Problem: Education: Goal: Knowledge of the prescribed therapeutic regimen will improve Outcome: Progressing Goal: Understanding of sexual limitations or changes related to disease process or condition will improve Outcome: Progressing Goal: Individualized Educational Video(s) Outcome: Progressing   Problem: Self-Concept: Goal: Communication of feelings regarding changes in body function or appearance will improve Outcome: Progressing   Problem: Skin Integrity: Goal: Demonstration of wound healing without infection will improve Outcome: Progressing   Problem: Education: Goal: Knowledge of condition will improve Outcome: Progressing Goal: Individualized Educational Video(s) Outcome: Progressing Goal: Individualized Newborn Educational Video(s) Outcome: Progressing   Problem: Activity: Goal: Will verbalize the importance of balancing activity with adequate rest periods Outcome: Progressing Goal: Ability to tolerate increased activity will improve Outcome: Progressing   Problem: Coping: Goal: Ability to identify and utilize available resources and services will improve Outcome: Progressing   Problem: Life Cycle: Goal: Chance of risk for complications during the postpartum period will decrease Outcome: Progressing    Problem: Role Relationship: Goal: Ability to demonstrate positive interaction with newborn will improve Outcome: Progressing   Problem: Skin Integrity: Goal: Demonstration of wound healing without infection will improve Outcome: Progressing

## 2023-04-13 NOTE — Social Work (Signed)
[name] was referred for history of depression/anxiety.  * Referral screened out by Clinical Social Worker because none of the following criteria appear to apply:  ~ History of anxiety/depression during this pregnancy, or of post-partum depression following prior delivery.  ~ Diagnosis of anxiety and/or depression within last 3 years OR * [name]'s symptoms currently being treated with medication and/or therapy.  Per chart review, [name] was diagnosed prior to August 2021. No concerns noted during this pregnancy.  Please contact the Clinical Social Worker if needs arise, or by [name] request.   Morgan Townsend, LCSWA Clinical Social Worker [phone]

## 2023-04-13 NOTE — Lactation Note (Signed)
This note was copied from a baby's chart. Lactation Consultation Note  Patient Name: Morgan Townsend GLOVF'I Date: 04/13/2023 Age:34 hours Reason for consult: Initial assessment;Term;Infant weight loss (6 % weight loss, per mom recently attempted to latch. Parents are just starting to eat their breakfast. LC mentioned she would come back to see if the baby would feed and for and assessment.)   Maternal Data    Feeding Mother's Current Feeding Choice: Breast Milk  Interventions  Education about the use of a pacifier    Consult Status Consult Status: Follow-up    Morgan Townsend 04/13/2023, 9:21 AM

## 2023-04-13 NOTE — Plan of Care (Signed)
Problem: Education: Goal: Knowledge of Childbirth will improve 04/13/2023 1415 by Donne Hazel, LPN Outcome: Adequate for Discharge 04/13/2023 4098 by Donne Hazel, LPN Outcome: Progressing Goal: Ability to make informed decisions regarding treatment and plan of care will improve 04/13/2023 1415 by Donne Hazel, LPN Outcome: Adequate for Discharge 04/13/2023 0904 by Donne Hazel, LPN Outcome: Progressing Goal: Ability to state and carry out methods to decrease the pain will improve 04/13/2023 1415 by Donne Hazel, LPN Outcome: Adequate for Discharge 04/13/2023 0904 by Donne Hazel, LPN Outcome: Progressing Goal: Individualized Educational Video(s) 04/13/2023 1415 by Donne Hazel, LPN Outcome: Adequate for Discharge 04/13/2023 0904 by Donne Hazel, LPN Outcome: Progressing   Problem: Coping: Goal: Ability to verbalize concerns and feelings about labor and delivery will improve 04/13/2023 1415 by Donne Hazel, LPN Outcome: Adequate for Discharge 04/13/2023 0904 by Donne Hazel, LPN Outcome: Progressing   Problem: Life Cycle: Goal: Ability to make normal progression through stages of labor will improve 04/13/2023 1415 by Donne Hazel, LPN Outcome: Adequate for Discharge 04/13/2023 1191 by Donne Hazel, LPN Outcome: Progressing Goal: Ability to effectively push during vaginal delivery will improve 04/13/2023 1415 by Donne Hazel, LPN Outcome: Adequate for Discharge 04/13/2023 4782 by Donne Hazel, LPN Outcome: Progressing   Problem: Role Relationship: Goal: Will demonstrate positive interactions with the child 04/13/2023 1415 by Donne Hazel, LPN Outcome: Adequate for Discharge 04/13/2023 9562 by Donne Hazel, LPN Outcome: Progressing   Problem: Safety: Goal: Risk of complications during labor and delivery will decrease 04/13/2023 1415 by Donne Hazel, LPN Outcome: Adequate for Discharge 04/13/2023 1308 by Donne Hazel, LPN Outcome: Progressing    Problem: Pain Management: Goal: Relief or control of pain from uterine contractions will improve 04/13/2023 1415 by Donne Hazel, LPN Outcome: Adequate for Discharge 04/13/2023 6578 by Donne Hazel, LPN Outcome: Progressing   Problem: Education: Goal: Knowledge of General Education information will improve Description: Including pain rating scale, medication(s)/side effects and non-pharmacologic comfort measures 04/13/2023 1415 by Donne Hazel, LPN Outcome: Adequate for Discharge 04/13/2023 4696 by Donne Hazel, LPN Outcome: Progressing   Problem: Health Behavior/Discharge Planning: Goal: Ability to manage health-related needs will improve 04/13/2023 1415 by Donne Hazel, LPN Outcome: Adequate for Discharge 04/13/2023 2952 by Donne Hazel, LPN Outcome: Progressing   Problem: Clinical Measurements: Goal: Ability to maintain clinical measurements within normal limits will improve 04/13/2023 1415 by Donne Hazel, LPN Outcome: Adequate for Discharge 04/13/2023 8413 by Donne Hazel, LPN Outcome: Progressing Goal: Will remain free from infection 04/13/2023 1415 by Donne Hazel, LPN Outcome: Adequate for Discharge 04/13/2023 2440 by Donne Hazel, LPN Outcome: Progressing Goal: Diagnostic test results will improve 04/13/2023 1415 by Donne Hazel, LPN Outcome: Adequate for Discharge 04/13/2023 1027 by Donne Hazel, LPN Outcome: Progressing Goal: Respiratory complications will improve 04/13/2023 1415 by Donne Hazel, LPN Outcome: Adequate for Discharge 04/13/2023 2536 by Donne Hazel, LPN Outcome: Progressing Goal: Cardiovascular complication will be avoided 04/13/2023 1415 by Donne Hazel, LPN Outcome: Adequate for Discharge 04/13/2023 6440 by Donne Hazel, LPN Outcome: Progressing   Problem: Activity: Goal: Risk for activity intolerance will decrease 04/13/2023 1415 by Donne Hazel, LPN Outcome: Adequate for Discharge 04/13/2023 3474 by Donne Hazel,  LPN Outcome: Progressing   Problem: Nutrition: Goal: Adequate nutrition will be maintained 04/13/2023 1415 by Donne Hazel, LPN Outcome: Adequate for Discharge 04/13/2023 0904 by  Adam Phenix A, LPN Outcome: Progressing   Problem: Coping: Goal: Level of anxiety will decrease 04/13/2023 1415 by Donne Hazel, LPN Outcome: Adequate for Discharge 04/13/2023 1610 by Donne Hazel, LPN Outcome: Progressing   Problem: Elimination: Goal: Will not experience complications related to bowel motility 04/13/2023 1415 by Donne Hazel, LPN Outcome: Adequate for Discharge 04/13/2023 0904 by Donne Hazel, LPN Outcome: Progressing Goal: Will not experience complications related to urinary retention 04/13/2023 1415 by Donne Hazel, LPN Outcome: Adequate for Discharge 04/13/2023 0904 by Donne Hazel, LPN Outcome: Progressing   Problem: Pain Managment: Goal: General experience of comfort will improve 04/13/2023 1415 by Donne Hazel, LPN Outcome: Adequate for Discharge 04/13/2023 0904 by Donne Hazel, LPN Outcome: Progressing   Problem: Safety: Goal: Ability to remain free from injury will improve 04/13/2023 1415 by Donne Hazel, LPN Outcome: Adequate for Discharge 04/13/2023 0904 by Donne Hazel, LPN Outcome: Progressing   Problem: Skin Integrity: Goal: Risk for impaired skin integrity will decrease 04/13/2023 1415 by Donne Hazel, LPN Outcome: Adequate for Discharge 04/13/2023 0904 by Donne Hazel, LPN Outcome: Progressing   Problem: Education: Goal: Knowledge of the prescribed therapeutic regimen will improve 04/13/2023 1415 by Donne Hazel, LPN Outcome: Adequate for Discharge 04/13/2023 9604 by Donne Hazel, LPN Outcome: Progressing Goal: Understanding of sexual limitations or changes related to disease process or condition will improve 04/13/2023 1415 by Donne Hazel, LPN Outcome: Adequate for Discharge 04/13/2023 5409 by Donne Hazel, LPN Outcome:  Progressing Goal: Individualized Educational Video(s) 04/13/2023 1415 by Donne Hazel, LPN Outcome: Adequate for Discharge 04/13/2023 0904 by Donne Hazel, LPN Outcome: Progressing   Problem: Self-Concept: Goal: Communication of feelings regarding changes in body function or appearance will improve 04/13/2023 1415 by Donne Hazel, LPN Outcome: Adequate for Discharge 04/13/2023 8119 by Donne Hazel, LPN Outcome: Progressing   Problem: Skin Integrity: Goal: Demonstration of wound healing without infection will improve 04/13/2023 1415 by Donne Hazel, LPN Outcome: Adequate for Discharge 04/13/2023 1478 by Donne Hazel, LPN Outcome: Progressing   Problem: Education: Goal: Knowledge of condition will improve 04/13/2023 1415 by Donne Hazel, LPN Outcome: Adequate for Discharge 04/13/2023 2956 by Donne Hazel, LPN Outcome: Progressing Goal: Individualized Educational Video(s) 04/13/2023 1415 by Donne Hazel, LPN Outcome: Adequate for Discharge 04/13/2023 2130 by Donne Hazel, LPN Outcome: Progressing Goal: Individualized Newborn Educational Video(s) 04/13/2023 1415 by Donne Hazel, LPN Outcome: Adequate for Discharge 04/13/2023 8657 by Donne Hazel, LPN Outcome: Progressing   Problem: Activity: Goal: Will verbalize the importance of balancing activity with adequate rest periods 04/13/2023 1415 by Donne Hazel, LPN Outcome: Adequate for Discharge 04/13/2023 8469 by Donne Hazel, LPN Outcome: Progressing Goal: Ability to tolerate increased activity will improve 04/13/2023 1415 by Donne Hazel, LPN Outcome: Adequate for Discharge 04/13/2023 0904 by Donne Hazel, LPN Outcome: Progressing   Problem: Coping: Goal: Ability to identify and utilize available resources and services will improve 04/13/2023 1415 by Donne Hazel, LPN Outcome: Adequate for Discharge 04/13/2023 6295 by Donne Hazel, LPN Outcome: Progressing   Problem: Life Cycle: Goal: Chance of  risk for complications during the postpartum period will decrease 04/13/2023 1415 by Donne Hazel, LPN Outcome: Adequate for Discharge 04/13/2023 2841 by Donne Hazel, LPN Outcome: Progressing   Problem: Role Relationship: Goal: Ability to demonstrate positive interaction with newborn will improve 04/13/2023 1415 by Donne Hazel, LPN Outcome:  Adequate for Discharge 04/13/2023 9811 by Donne Hazel, LPN Outcome: Progressing   Problem: Skin Integrity: Goal: Demonstration of wound healing without infection will improve 04/13/2023 1415 by Donne Hazel, LPN Outcome: Adequate for Discharge 04/13/2023 9147 by Donne Hazel, LPN Outcome: Progressing

## 2023-04-13 NOTE — Discharge Summary (Signed)
Postpartum Discharge Summary    Patient Name: Morgan Townsend DOB: 17-Jan-1991 MRN: [id number]  Date of admission: 04/11/2023 Delivery date:04/12/2023 Delivering provider: Marlow Baars Date of discharge: 04/13/2023  Admitting diagnosis: [redacted] weeks gestation of pregnancy [Z3A.40] TOLAC Intrauterine pregnancy: [redacted]w[redacted]d     Secondary diagnosis:  Principal Problem:   [redacted] weeks gestation of pregnancy  Additional problems: none    Discharge diagnosis: Term Pregnancy Delivered                                              Post partum procedures: none Augmentation: Pitocin Complications: None  Hospital course: Induction of Labor With Cesarean Section   32 y.o. yo G2P2002 at [redacted]w[redacted]d was admitted to the hospital 04/11/2023 for induction of labor. Patient had a labor course significant for nonreassuring fetal heart tracing. The patient went for cesarean section due to Non-Reassuring FHR. Delivery details are as follows: Membrane Rupture Time/Date: 8:25 PM,04/11/2023  Delivery Method:C-Section, Low Transverse Operative Delivery:N/A Details of operation can be found in separate operative Note.  Patient had a postpartum course complicated by nothing. She is ambulating, tolerating a regular diet, passing flatus, and urinating well.  Patient is discharged home in stable condition on 04/13/23.      Newborn Data: Birth date:04/12/2023 Birth time:7:06 AM Gender:Female Living status:Living Apgars:9 ,9  Weight:3860 g                               Magnesium Sulfate received: No BMZ received: No Rhophylac:N/A Transfusion:No  Physical exam  Vitals:   04/12/23 2157 04/13/23 0153 04/13/23 0528 04/13/23 1336  BP:  98/66 94/60 99/67   Pulse:  66 70 69  Resp:  16  18  Temp: 98.5 F (36.9 C) 97.8 F (36.6 C) 97.9 F (36.6 C) 98 F (36.7 C)  TempSrc: Oral Oral Oral Oral  SpO2:  98% 99%   Weight:      Height:       General: alert, cooperative, and no distress Lochia: appropriate Uterine Fundus:  firm Incision: Healing well with no significant drainage DVT Evaluation: No evidence of DVT seen on physical exam. Labs: Lab Results  Component Value Date   WBC 16.0 (H) 04/13/2023   HGB 11.8 (L) 04/13/2023   HCT 34.0 (L) 04/13/2023   MCV 98.0 04/13/2023   PLT 186 04/13/2023      Latest Ref Rng & Units 07/28/2021    4:19 PM  CMP  Glucose 70 - 99 mg/dL 469   BUN 6 - 20 mg/dL 7   Creatinine 6.29 - 5.28 mg/dL 4.13   Sodium 244 - 010 mmol/L 138   Potassium 3.5 - 5.1 mmol/L 3.4   Chloride 98 - 111 mmol/L 106   CO2 22 - 32 mmol/L 22   Calcium 8.9 - 10.3 mg/dL 9.3   Total Protein 6.5 - 8.1 g/dL 7.3   Total Bilirubin 0.3 - 1.2 mg/dL 0.4   Alkaline Phos 38 - 126 U/L 35   AST 15 - 41 U/L 10   ALT 0 - 44 U/L 7    Edinburgh Score:    04/12/2023    4:15 PM  Edinburgh Postnatal Depression Scale Screening Tool  I have been able to laugh and see the funny side of things. 0  I have looked forward with enjoyment to  things. 0  I have blamed myself unnecessarily when things went wrong. 0  I have been anxious or worried for no good reason. 1  I have felt scared or panicky for no good reason. 0  Things have been getting on top of me. 0  I have been so unhappy that I have had difficulty sleeping. 0  I have felt sad or miserable. 0  I have been so unhappy that I have been crying. 0  The thought of harming myself has occurred to me. 0  Edinburgh Postnatal Depression Scale Total 1      After visit meds:  Allergies as of 04/13/2023   No Known Allergies      Medication List     TAKE these medications    oxyCODONE 5 MG immediate release tablet Commonly known as: Oxy IR/ROXICODONE Take 1-2 tablets (5-10 mg total) by mouth every 4 (four) hours as needed for moderate pain.               Discharge Care Instructions  (From admission, onward)           Start     Ordered   04/13/23 0000  Discharge wound care:       Comments: Can remove dressing and steri strips 5 days from  date of surgery   04/13/23 1411             Discharge home in stable condition Infant Disposition:home with mother Discharge instruction: per After Visit Summary and Postpartum booklet. Activity: Advance as tolerated. Pelvic rest for 6 weeks.  Diet: routine diet Anticipated Birth Control: Unsure Postpartum Appointment:4 weeks Additional Postpartum F/U: Postpartum Depression checkup Future Appointments:No future appointments. Follow up Visit:  Follow-up Information     Ob/Gyn, Nestor Ramp. Call in 4 week(s).   Contact information: [address] [place] [address] [phone]                     04/13/2023 Philip Aspen, DO

## 2023-04-13 NOTE — Discharge Instructions (Signed)
WHAT TO LOOK OUT FOR: Fever of 100.4 or above Mastitis: feels like flu and breasts hurt Infection: increased pain, swelling or redness Blood clots golf ball size or larger Postpartum depression   Congratulations on your newest addition!

## 2023-04-13 NOTE — Anesthesia Postprocedure Evaluation (Signed)
Anesthesia Post Note  Patient: Morgan Townsend  Procedure(s) Performed: CESAREAN SECTION (Abdomen)     Patient location during evaluation: PACU Anesthesia Type: Epidural Level of consciousness: awake, awake and alert and oriented Pain management: pain level controlled Vital Signs Assessment: post-procedure vital signs reviewed and stable Respiratory status: spontaneous breathing, nonlabored ventilation and respiratory function stable Cardiovascular status: blood pressure returned to baseline and stable Postop Assessment: no headache, no backache, no apparent nausea or vomiting, patient able to bend at knees and epidural receding Anesthetic complications: no   No notable events documented.  Last Vitals:  Vitals:   04/13/23 0153 04/13/23 0528  BP: 98/66 94/60  Pulse: 66 70  Resp: 16   Temp: 36.6 C 36.6 C  SpO2: 98% 99%    Last Pain:  Vitals:   04/13/23 0528  TempSrc: Oral  PainSc: 6                  Collene Schlichter

## 2023-04-13 NOTE — Progress Notes (Signed)
Patient is eating, ambulating, voiding.  Pain control is good.  Appropriate lochia.  No flatus, no complaints.  Vitals:   04/12/23 1615 04/12/23 2157 04/13/23 0153 04/13/23 0528  BP: 113/68  98/66 94/60  Pulse: 65  66 70  Resp: 18  16   Temp: 98.2 F (36.8 C) 98.5 F (36.9 C) 97.8 F (36.6 C) 97.9 F (36.6 C)  TempSrc: Oral Oral Oral Oral  SpO2: 100%  98% 99%  Weight:      Height:        Fundus firm Inc: c/d/I Ext: no calf tenderness  Lab Results  Component Value Date   WBC 16.0 (H) 04/13/2023   HGB 11.8 (L) 04/13/2023   HCT 34.0 (L) 04/13/2023   MCV 98.0 04/13/2023   PLT 186 04/13/2023    --/--/A POS (08/25 1824)  A/P Post op day #1 s/p repeat C/S for NFHRT, failed TOLAC. Pt feeling well and desiring early discharge today. Recommend ambulation to stimulated flatus. Await peds d/c and if pt continues to do well, can d/c home later today.  Routine care.    Philip Aspen

## 2023-04-13 NOTE — Lactation Note (Addendum)
This note was copied from a baby's chart. Lactation Consultation Note  Patient Name: Morgan Townsend ZYSAY'T Date: 04/13/2023 Age:34 hours - per LC from nights - mom requested to wait until am to see LC. 2nd visit. P1, experienced BF  Reason for consult: Follow-up assessment;Term;Infant weight loss (6 % weight loss) As LC entered the room and per mom preparing to feed.  LC. LC offered to assist. Started on the left breast, football, and baby latched when depth. Baby had hiccups and took several attempted to latch and was still feeding at 15 mins with swallows. Per mom comfortable. LC assisted to work on depth. Multiple swallows noted with compressions. Latch score 8  Mom is experienced, but its been 7 years.  LC reviewed moms I/O sheets and wets and stools correlate with the  6 % weight loss.  LC reviewed the 24 hour feeding goals for feed with feeding cues and by 3 hours STS.  Maternal Data Has patient been taught Hand Expression?:  (per mom is experience and feels comfortabe) Does the patient have breastfeeding experience prior to this delivery?: Yes How long did the patient breastfeed?: per mom 1 year  Feeding Mother's Current Feeding Choice: Breast Milk  LATCH Score Latch: Repeated attempts needed to sustain latch, nipple held in mouth throughout feeding, stimulation needed to elicit sucking reflex.  Audible Swallowing: Spontaneous and intermittent  Type of Nipple: Everted at rest and after stimulation  Comfort (Breast/Nipple): Soft / non-tender  Hold (Positioning): Assistance needed to correctly position infant at breast and maintain latch.  LATCH Score: 8   Lactation Tools Discussed/Used  None needed as of yet   Interventions Interventions: Breast feeding basics reviewed;Assisted with latch;Skin to skin;Breast massage;Hand express;Breast compression;Adjust position;Support pillows;Position options;Education;LC Services brochure  Discharge Pump: Hands  Free;Personal WIC Program: No  Consult Status Consult Status: Follow-up 04/14/2023    Matilde Sprang Morgan Townsend 04/13/2023, 10:08 AM

## 2023-04-14 LAB — SURGICAL PATHOLOGY

## 2023-05-13 ENCOUNTER — Telehealth (HOSPITAL_COMMUNITY): Payer: Self-pay | Admitting: *Deleted

## 2023-05-13 NOTE — Telephone Encounter (Signed)
05/13/2023  Name: Genet Fischl MRN: [id number] DOB: 07/02/1991  Reason for Call:  Transition of Care Hospital Discharge Call  Contact Status: Patient Contact Status: Message  Language assistant needed:          Follow-Up Questions:    Inocente Salles Postnatal Depression Scale:  In the Past 7 Days:    PHQ2-9 Depression Scale:     Discharge Follow-up:    Post-discharge interventions: NA  Salena Saner, RN 05/13/2023 14:01

## 2023-05-28 IMAGING — MR MR LUMBAR SPINE W/O CM
4 of 5 series · 27 of 48 positions shown · non-contrast
Comparison: MR examination dated June 09, 2021

CLINICAL DATA: Low back pain occasional right leg pain. History of
injection 6 weeks ago. History of back surgery last [REDACTED].

EXAM:
MRI LUMBAR SPINE WITHOUT CONTRAST
TECHNIQUE: Multiplanar, multisequence MR imaging of the lumbar spine was
performed. No intravenous contrast was administered.

[Series 3: T2 · sagittal · 4.0mm · 1.09mm/px · 6 of 17 slices shown (1 of 2)]
[im 1/17]
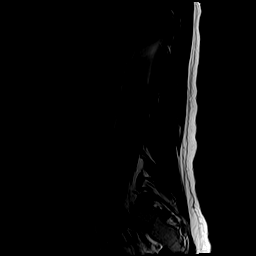
[im 4/17]
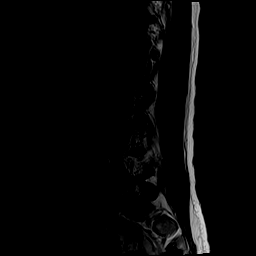
[im 7/17]
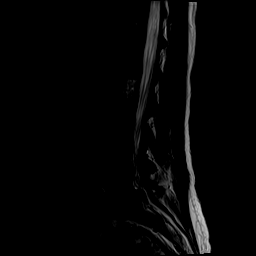
[im 10/17]
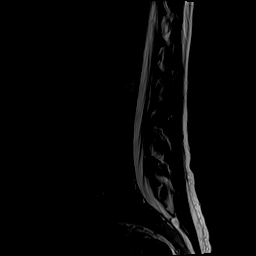
[im 13/17]
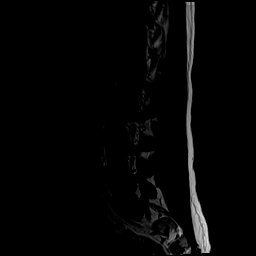
[im 17/17]
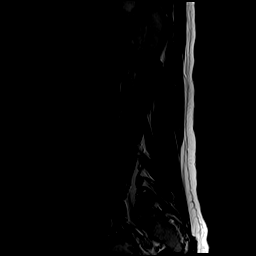

[Series 5: T1 · sagittal · 4.0mm · 1.09mm/px · 6 of 17 slices shown (1 of 2)]
[im 1/17]
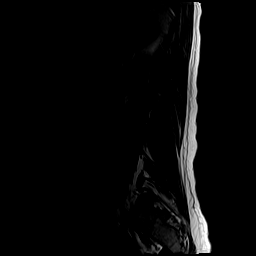
[im 4/17]
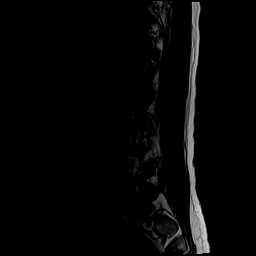
[im 7/17]
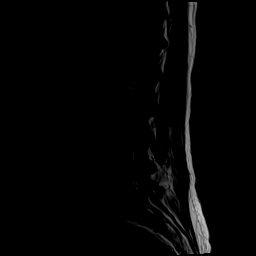
[im 10/17]
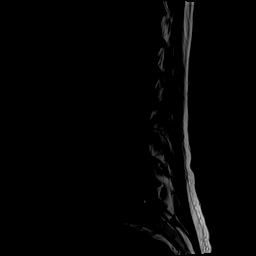
[im 13/17]
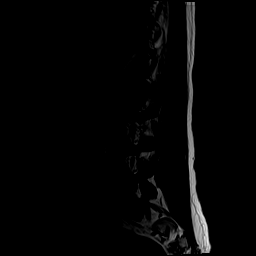
[im 17/17]
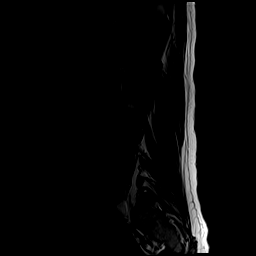

[Series 6: T2 · axial · 4.0mm · 0.39mm/px · z∈[-71,+160]mm · 9 of 44 slices shown (2 of 2)]
[im 1/44]
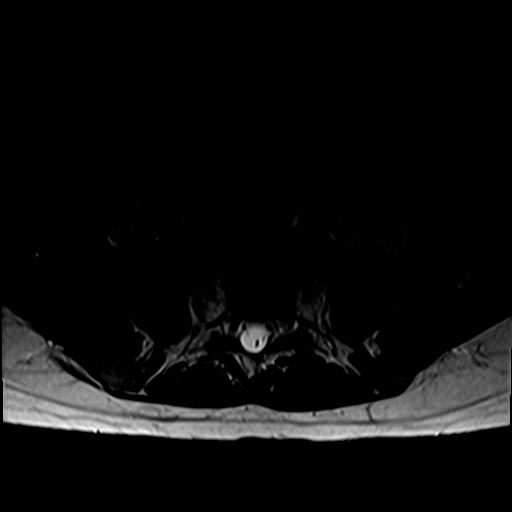
[im 7/44]
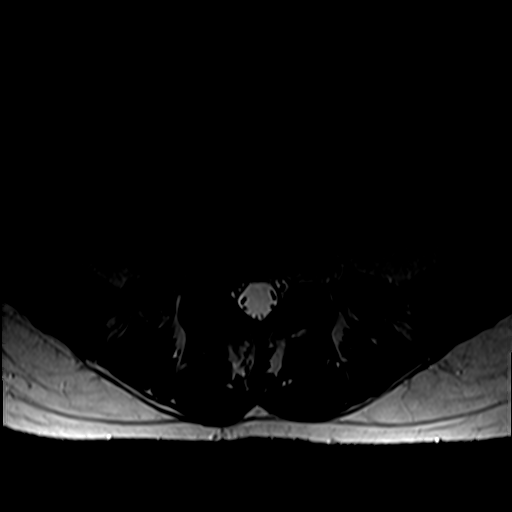
[im 13/44]
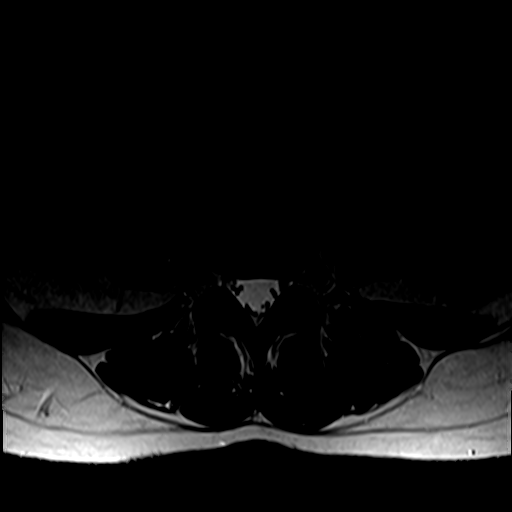
[im 19/44]
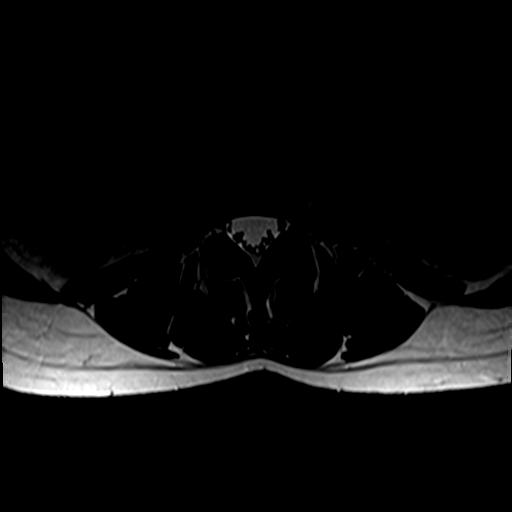
[im 22/44]
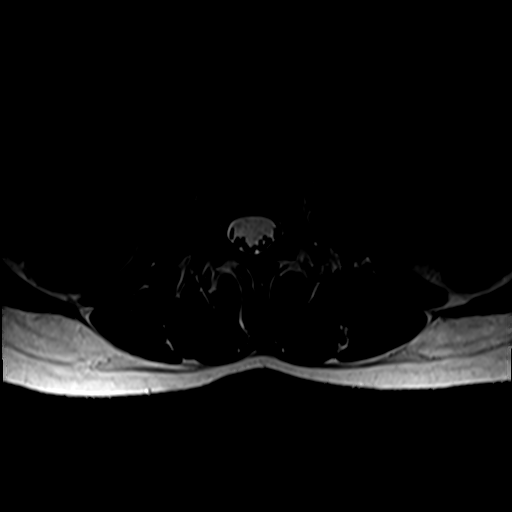
[im 25/44]
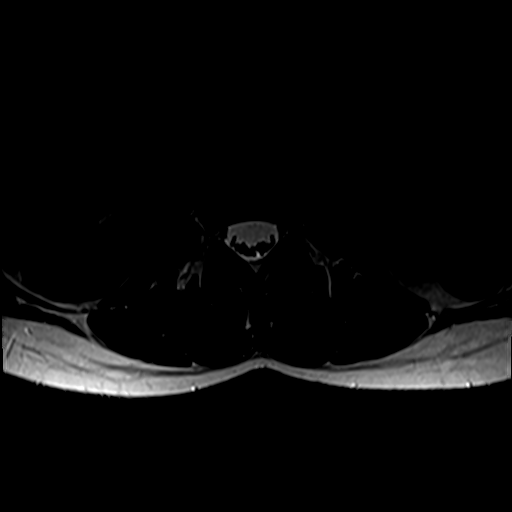
[im 31/44]
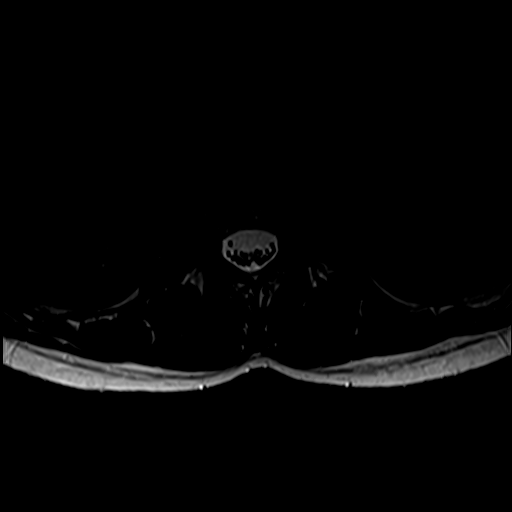
[im 37/44]
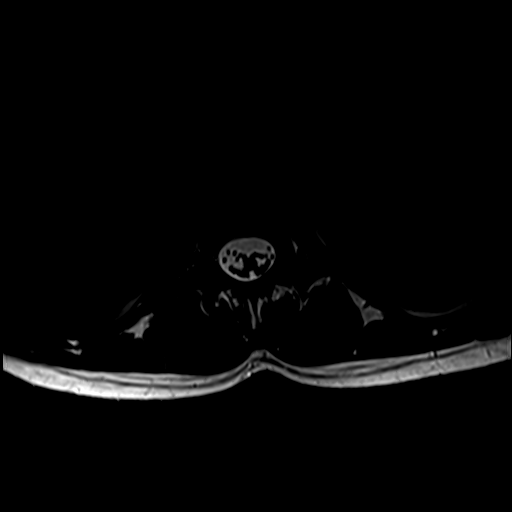
[im 44/44]
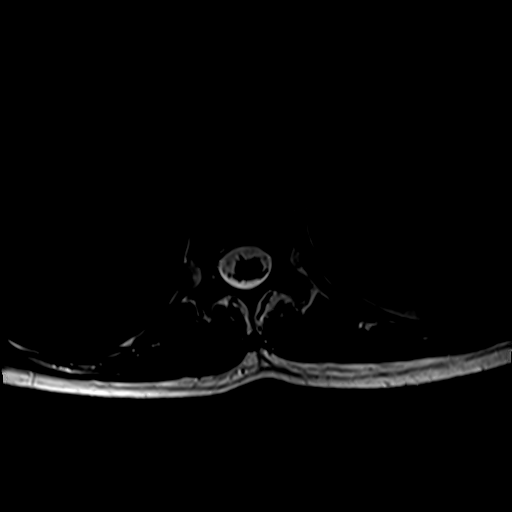

[Series 7: T1 · axial · 4.0mm · 0.39mm/px · z∈[-71,+126]mm · 6 of 44 slices shown (2 of 2)]
[im 1/44]
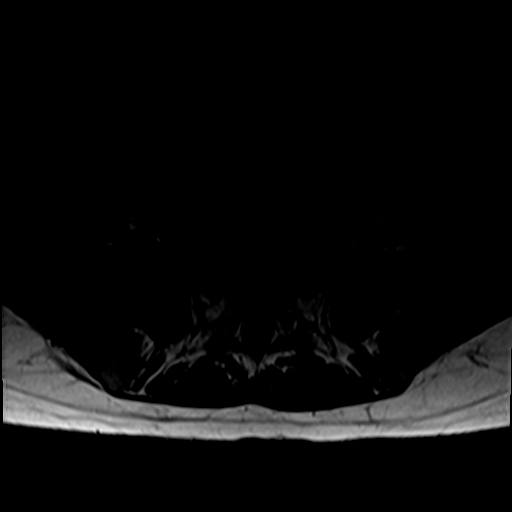
[im 7/44]
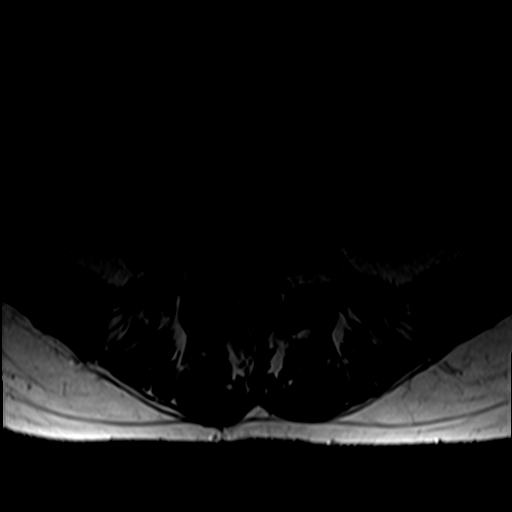
[im 13/44]
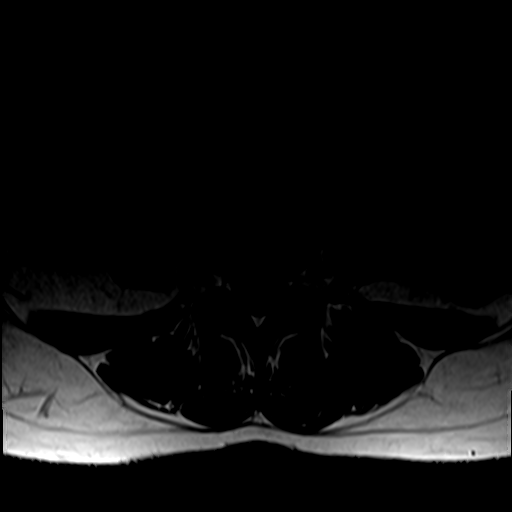
[im 19/44]
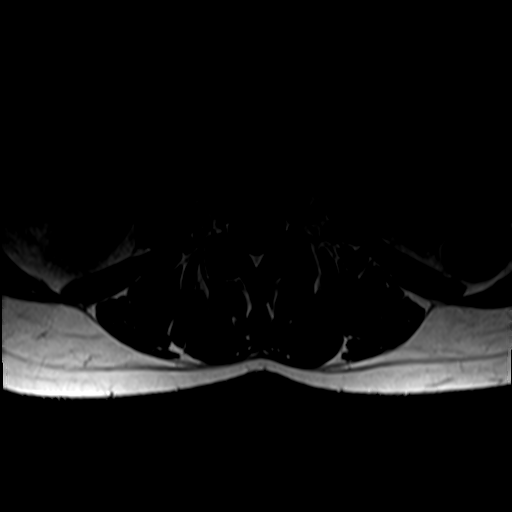
[im 22/44]
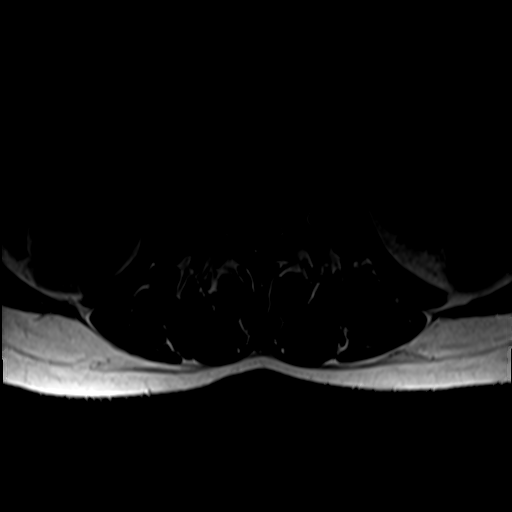
[im 37/44]
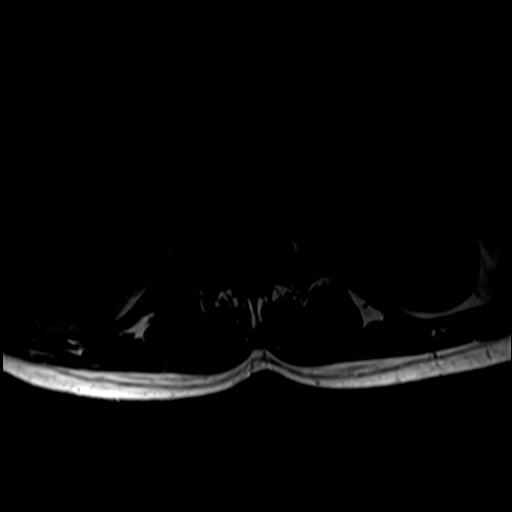

[27 of 48 positions shown; findings below may reference images not displayed]

FINDINGS: Segmentation:  Standard.

Alignment:  Straightening of the lumbar spine.

Vertebrae: No fracture or evidence of discitis. Large hemangioma in
the L2 vertebral body without evidence of complications. No edema.

Conus medullaris and cauda equina: Conus extends to the L1 level.
Conus and cauda equina appear normal.

Paraspinal and other soft tissues: Negative.

Disc levels:

T12-L1: No significant disc bulge. No neural foraminal stenosis. No
central canal stenosis.

L1-L2: No significant disc bulge. No neural foraminal stenosis. No
central canal stenosis.

L2-L3: No significant disc bulge. No neural foraminal stenosis. No
central canal stenosis.

L3-L4: No significant disc bulge. No neural foraminal stenosis. No
central canal stenosis.

L4-L5: Broad-based disc bulge with mild narrowing of lateral
recesses bilaterally. Ligamentum flavum hypertrophy and mild facet
joint arthropathy bilaterally. No significant neural foraminal
stenosis.

L5-S1: Disc desiccation and disc height loss. There is large right
paracentral disc extrusion which completely obscures the right
lateral recess with encroachment of the descending S1 root on the
right. There is also encroachment of the L5 nerve root with mild
right neural foraminal stenosis (axial image 39; sagittal image 6).
This is new since prior examination May 2021.
IMPRESSION: 1. Interval development of marked right paracentral disc extrusion
at L5-S1 with encroachment of the right S1 nerve root as well as
mild L5 neural foraminal stenosis.

2.  Mild multilevel degenerate disc disease at L4-L5.

3.  Stable L2 large hemangioma without evidence of complications.

## 2023-06-25 ENCOUNTER — Other Ambulatory Visit (HOSPITAL_COMMUNITY): Payer: Self-pay

## 2023-10-18 ENCOUNTER — Other Ambulatory Visit: Payer: Self-pay | Admitting: Orthopedic Surgery

## 2023-10-18 DIAGNOSIS — M5116 Intervertebral disc disorders with radiculopathy, lumbar region: Secondary | ICD-10-CM

## 2023-10-18 DIAGNOSIS — M961 Postlaminectomy syndrome, not elsewhere classified: Secondary | ICD-10-CM
